# Patient Record
Sex: Female | Born: 1980
Health system: Southern US, Community
[De-identification: ages and names within clinical notes are randomized; demographics above are authoritative.]

## PROBLEM LIST (undated history)

## (undated) DIAGNOSIS — F32A Depression, unspecified: Secondary | ICD-10-CM

## (undated) DIAGNOSIS — J45909 Unspecified asthma, uncomplicated: Secondary | ICD-10-CM

## (undated) DIAGNOSIS — G2581 Restless legs syndrome: Secondary | ICD-10-CM

## (undated) DIAGNOSIS — G43909 Migraine, unspecified, not intractable, without status migrainosus: Secondary | ICD-10-CM

## (undated) DIAGNOSIS — F329 Major depressive disorder, single episode, unspecified: Secondary | ICD-10-CM

## (undated) DIAGNOSIS — M1990 Unspecified osteoarthritis, unspecified site: Secondary | ICD-10-CM

## (undated) DIAGNOSIS — F419 Anxiety disorder, unspecified: Secondary | ICD-10-CM

## (undated) DIAGNOSIS — M357 Hypermobility syndrome: Secondary | ICD-10-CM

## (undated) HISTORY — PX: NO PAST SURGERIES: SHX2092

## (undated) HISTORY — DX: Unspecified asthma, uncomplicated: J45.909

## (undated) HISTORY — DX: Unspecified osteoarthritis, unspecified site: M19.90

## (undated) HISTORY — DX: Depression, unspecified: F32.A

## (undated) HISTORY — DX: Anxiety disorder, unspecified: F41.9

## (undated) HISTORY — DX: Hypermobility syndrome: M35.7

## (undated) HISTORY — DX: Migraine, unspecified, not intractable, without status migrainosus: G43.909

## (undated) HISTORY — DX: Major depressive disorder, single episode, unspecified: F32.9

## (undated) HISTORY — DX: Restless legs syndrome: G25.81

---

## 2015-07-09 DIAGNOSIS — M549 Dorsalgia, unspecified: Secondary | ICD-10-CM | POA: Diagnosis not present

## 2015-07-09 DIAGNOSIS — N3000 Acute cystitis without hematuria: Secondary | ICD-10-CM | POA: Diagnosis not present

## 2015-08-25 DIAGNOSIS — F39 Unspecified mood [affective] disorder: Secondary | ICD-10-CM | POA: Diagnosis not present

## 2015-08-25 DIAGNOSIS — Z0000 Encounter for general adult medical examination without abnormal findings: Secondary | ICD-10-CM | POA: Diagnosis not present

## 2015-08-25 DIAGNOSIS — R110 Nausea: Secondary | ICD-10-CM | POA: Diagnosis not present

## 2015-08-25 DIAGNOSIS — M2550 Pain in unspecified joint: Secondary | ICD-10-CM | POA: Diagnosis not present

## 2015-08-25 DIAGNOSIS — R21 Rash and other nonspecific skin eruption: Secondary | ICD-10-CM | POA: Diagnosis not present

## 2015-08-25 DIAGNOSIS — M791 Myalgia: Secondary | ICD-10-CM | POA: Diagnosis not present

## 2015-08-25 DIAGNOSIS — G629 Polyneuropathy, unspecified: Secondary | ICD-10-CM | POA: Diagnosis not present

## 2015-10-12 ENCOUNTER — Other Ambulatory Visit: Payer: Self-pay | Admitting: *Deleted

## 2015-10-12 MED ORDER — ROPINIROLE HCL 0.5 MG PO TABS: ORAL_TABLET | ORAL | 3 refills | Status: DC

## 2015-10-17 ENCOUNTER — Other Ambulatory Visit: Payer: Self-pay | Admitting: *Deleted

## 2015-10-17 MED ORDER — ALPRAZOLAM 0.5 MG PO TBDP: ORAL_TABLET | ORAL | 0 refills | Status: DC

## 2015-10-17 MED ORDER — ALPRAZOLAM 0.5 MG PO TABS: ORAL_TABLET | ORAL | 0 refills | Status: DC

## 2015-10-17 NOTE — Telephone Encounter (Signed)
rx called into pharmacy

## 2015-10-17 NOTE — Telephone Encounter (Signed)
Last filled 08/30/2015. Please advise

## 2015-10-20 ENCOUNTER — Ambulatory Visit: Payer: Self-pay | Admitting: Physician Assistant

## 2015-11-06 ENCOUNTER — Other Ambulatory Visit: Payer: Self-pay | Admitting: Physician Assistant

## 2015-12-07 ENCOUNTER — Other Ambulatory Visit: Payer: Self-pay

## 2015-12-21 ENCOUNTER — Ambulatory Visit: Payer: Self-pay

## 2015-12-22 ENCOUNTER — Ambulatory Visit (INDEPENDENT_AMBULATORY_CARE_PROVIDER_SITE_OTHER): Payer: BLUE CROSS/BLUE SHIELD | Admitting: Physician Assistant

## 2015-12-22 ENCOUNTER — Encounter: Payer: Self-pay | Admitting: Physician Assistant

## 2015-12-22 VITALS — BP 118/81 | HR 80 | Temp 97.4°F | Ht 68.0 in | Wt 180.6 lb

## 2015-12-22 DIAGNOSIS — Z309 Encounter for contraceptive management, unspecified: Secondary | ICD-10-CM

## 2015-12-22 DIAGNOSIS — G43909 Migraine, unspecified, not intractable, without status migrainosus: Secondary | ICD-10-CM | POA: Insufficient documentation

## 2015-12-22 DIAGNOSIS — M357 Hypermobility syndrome: Secondary | ICD-10-CM | POA: Diagnosis not present

## 2015-12-22 DIAGNOSIS — G2581 Restless legs syndrome: Secondary | ICD-10-CM | POA: Diagnosis not present

## 2015-12-22 DIAGNOSIS — R059 Cough, unspecified: Secondary | ICD-10-CM

## 2015-12-22 DIAGNOSIS — G43801 Other migraine, not intractable, with status migrainosus: Secondary | ICD-10-CM | POA: Diagnosis not present

## 2015-12-22 DIAGNOSIS — Z23 Encounter for immunization: Secondary | ICD-10-CM | POA: Diagnosis not present

## 2015-12-22 DIAGNOSIS — R05 Cough: Secondary | ICD-10-CM | POA: Diagnosis not present

## 2015-12-22 DIAGNOSIS — F3341 Major depressive disorder, recurrent, in partial remission: Secondary | ICD-10-CM | POA: Insufficient documentation

## 2015-12-22 DIAGNOSIS — M25569 Pain in unspecified knee: Secondary | ICD-10-CM | POA: Diagnosis not present

## 2015-12-22 MED ORDER — ROPINIROLE HCL 2 MG PO TABS: ORAL_TABLET | ORAL | 6 refills | Status: DC

## 2015-12-22 MED ORDER — ALBUTEROL SULFATE HFA 108 (90 BASE) MCG/ACT IN AERS: 2.0000 | INHALATION_SPRAY | Freq: Four times a day (QID) | RESPIRATORY_TRACT | 0 refills | Status: DC | PRN

## 2015-12-22 MED ORDER — HYDROCODONE-ACETAMINOPHEN 10-325 MG PO TABS: 1.0000 | ORAL_TABLET | Freq: Four times a day (QID) | ORAL | 0 refills | Status: DC | PRN

## 2015-12-22 MED ORDER — HYDROCODONE-ACETAMINOPHEN 10-325 MG PO TABS: 1.0000 | ORAL_TABLET | Freq: Three times a day (TID) | ORAL | 0 refills | Status: DC | PRN

## 2015-12-22 MED ORDER — MEDROXYPROGESTERONE ACETATE 150 MG/ML IM SUSP
150.0000 mg | Freq: Once | INTRAMUSCULAR | Status: AC
  Administered 2016-03-16: 150 mg via INTRAMUSCULAR

## 2015-12-22 NOTE — Patient Instructions (Signed)

## 2015-12-22 NOTE — Progress Notes (Signed)
BP 118/81   Pulse 80   Temp 97.4 F (36.3 C) (Oral)   Ht 5\' 8"  (1.727 m)   Wt 180 lb 9.6 oz (81.9 kg)   LMP 12/22/2015   BMI 27.46 kg/m    Subjective:    Patient ID: Cynthia Hayden, female    DOB: Jul 19, 1980, 35 y.o.   MRN: [id number]  Cynthia Hayden is a 35 y.o. female presenting on 12/22/2015 for Cough  HPI Patient here to be established as new patient at [facility].  This patient is known to me from [facility]. Patient has long-standing medical history and is coming here to be established. At this time she is having increased cough that is worse when she lies down at night. She is not having any significant production with her cough. She has a past medical history of hypermobility joint syndrome. Her daughter has the same condition. She had been followed by rheumatology but at this point is is followed by [place]. All of her medications are reviewed today. We will also complete an initial pain visit questionnaire for her.  Past Medical History:  Diagnosis Date  . Anxiety   . Arthritis   . Depression   . Hypermobility syndrome   . Migraine   . RLS (restless legs syndrome)      Relevant past medical, surgical, family and social history reviewed and updated as indicated. Interim medical history since our last visit reviewed. Allergies and medications reviewed and updated.   Data reviewed from any sources in EPIC.  Review of Systems  Constitutional: Negative.  Negative for activity change, fatigue and fever.  HENT: Negative.   Eyes: Negative.   Respiratory: Positive for cough, shortness of breath and wheezing.   Cardiovascular: Negative.  Negative for chest pain.  Gastrointestinal: Negative.  Negative for abdominal pain.  Endocrine: Negative.   Genitourinary: Negative.  Negative for dysuria.  Musculoskeletal: Positive for arthralgias, joint swelling and myalgias.  Skin: Negative.   Neurological: Negative.   Psychiatric/Behavioral:  Positive for dysphoric mood. The patient is nervous/anxious.      Social History   Social History  . Marital status: Married    Spouse name: N/A  . Number of children: N/A  . Years of education: N/A   Occupational History  . Not on file.   Social History Main Topics  . Smoking status: Current Every Day Smoker    Packs/day: 1.00    Types: Cigarettes  . Smokeless tobacco: Never Used  . Alcohol use No  . Drug use: No  . Sexual activity: Not on file   Other Topics Concern  . Not on file   Social History Narrative  . No narrative on file    History reviewed. No pertinent surgical history.  Family History  Problem Relation Age of Onset  . Diabetes Mother       Medication List       Accurate as of 12/22/15 11:59 PM. Always use your most recent med list.          albuterol 108 (90 Base) MCG/ACT inhaler Commonly known as:  PROVENTIL HFA;VENTOLIN HFA Inhale 2 puffs into the lungs every 6 (six) hours as needed for wheezing or shortness of breath.   ALPRAZolam 0.5 MG tablet Commonly known as:  XANAX Take 1/2-1 twice daily as needed   HYDROcodone-acetaminophen 10-325 MG tablet Commonly known as:  NORCO Take 1-2 tablets by mouth every 6 (six) hours as needed.   HYDROcodone-acetaminophen 10-325 MG  tablet Commonly known as:  NORCO Take 1 tablet by mouth every 8 (eight) hours as needed.   HYDROcodone-acetaminophen 10-325 MG tablet Commonly known as:  NORCO Take 1 tablet by mouth every 8 (eight) hours as needed.   meloxicam 7.5 MG tablet Commonly known as:  MOBIC Take 7.5 mg by mouth daily.   rOPINIRole 2 MG tablet Commonly known as:  REQUIP Take 1-2 tablets at bedtime   SUMAtriptan 100 MG tablet Commonly known as:  IMITREX Take 100 mg by mouth every 2 (two) hours as needed for migraine. May repeat in 2 hours if headache persists or recurs.   topiramate 100 MG tablet Commonly known as:  TOPAMAX Take 100 mg by mouth 2 (two) times daily.   venlafaxine XR  75 MG 24 hr capsule Commonly known as:  EFFEXOR-XR Take 75 mg by mouth daily with breakfast.   Vitamin D3 1000 units Caps Take by mouth.          Objective:    BP 118/81   Pulse 80   Temp 97.4 F (36.3 C) (Oral)   Ht 5\' 8"  (1.727 m)   Wt 180 lb 9.6 oz (81.9 kg)   LMP 12/22/2015   BMI 27.46 kg/m   Allergies  Allergen Reactions  . Penicillins    Wt Readings from Last 3 Encounters:  12/22/15 180 lb 9.6 oz (81.9 kg)    Physical Exam  Constitutional: She is oriented to person, place, and time. She appears well-developed and well-nourished.  HENT:  Head: Normocephalic and atraumatic.  Eyes: Conjunctivae and EOM are normal. Pupils are equal, round, and reactive to light.  Neck: Normal range of motion. Neck supple.  Cardiovascular: Normal rate, regular rhythm, normal heart sounds and intact distal pulses.   Pulmonary/Chest: Effort normal and breath sounds normal.  Abdominal: Soft. Bowel sounds are normal.  Neurological: She is alert and oriented to person, place, and time. She has normal reflexes.  Skin: Skin is warm and dry. No rash noted.  Psychiatric: She has a normal mood and affect. Her behavior is normal. Judgment and thought content normal.    Beighton score for joint hypermobility*                                       SCORE:  8/9 Ability to: Left  Right  Passively dorsiflex the fifth metacarpophalangeal joint by at least 90 degrees 1  1  Oppose the thumb to the volar aspect of the ipsilateral forearm 1  1  Hyperextend the elbowby at least 10 degress 1  1  Hyperextend the knee by at least 10 degrees 1  1  Place the hands flat on the floor without bending the knees  0   * One point is scored for each of the maneuvers above. A total of nine points is achievable, and four or more points is considered an indication of generalized joint hypermobility.      Assessment & Plan:   1. Restless legs - rOPINIRole (REQUIP) 2 MG tablet; Take 1-2 tablets at bedtime   Dispense: 60 tablet; Refill: 6  2. Other migraine with status migrainosus, not intractable  3. Recurrent major depressive disorder, in partial remission (HCC)  4. Arthralgia of lower leg, unspecified laterality - HYDROcodone-acetaminophen (NORCO) 10-325 MG tablet; Take 1-2 tablets by mouth every 6 (six) hours as needed.  Dispense: 240 tablet; Refill: 0 - HYDROcodone-acetaminophen (NORCO) 10-325 MG  tablet; Take 1 tablet by mouth every 8 (eight) hours as needed.  Dispense: 240 tablet; Refill: 0 - HYDROcodone-acetaminophen (NORCO) 10-325 MG tablet; Take 1 tablet by mouth every 8 (eight) hours as needed.  Dispense: 240 tablet; Refill: 0  5. Benign joint hypermobility syndrome - HYDROcodone-acetaminophen (NORCO) 10-325 MG tablet; Take 1-2 tablets by mouth every 6 (six) hours as needed.  Dispense: 240 tablet; Refill: 0 - HYDROcodone-acetaminophen (NORCO) 10-325 MG tablet; Take 1 tablet by mouth every 8 (eight) hours as needed.  Dispense: 240 tablet; Refill: 0 - HYDROcodone-acetaminophen (NORCO) 10-325 MG tablet; Take 1 tablet by mouth every 8 (eight) hours as needed.  Dispense: 240 tablet; Refill: 0  6. Cough - albuterol (PROVENTIL HFA;VENTOLIN HFA) 108 (90 Base) MCG/ACT inhaler; Inhale 2 puffs into the lungs every 6 (six) hours as needed for wheezing or shortness of breath.  Dispense: 1 Inhaler; Refill: 0  7. Encounter for contraceptive management, unspecified type - medroxyPROGESTERone (DEPO-PROVERA) injection 150 mg; Inject 1 mL (150 mg total) into the muscle once.  8. Encounter for immunization - Flu Vaccine QUAD 36+ mos IM   Continue all other maintenance medications as listed above. Educational handout given for joint pain  Follow up plan: Return in about 3 months (around 03/23/2016) for recheck.  Remus LofflerAngel S. Edyn Qazi PA-C [facility] [address]  [place], [place] [id number] [phone]   12/23/2015, 10:28 AM      [facility]  Database reviewed- Yes If yes- were their any concerning findings : no   [employer]ox[employer] drug screen performed- No  SOAPP 0= never  1= seldom  2=sometimes  3= often  4= very often  1. How often do you have mood swings? 0 2. How often do you smoke a cigarette within an hour after waling up? 2 3. How often have you taken medication other than the way that it was prescribed?0 4. How often have you used illegal drugs in the past 5 years? 0 5. How often, in your lifetime, have you had legal problems or been arrested? 0  Score 2  Alcohol Audit - How often during the last year have found that you: 0-Never   1- Less than monthly   2- Monthly     3-Weekly     4-daily or almost daily  1. found that you were not able to stop drinking once you started- 0 2. failed to do what was normally expected of you because of drinking- 0 3. needed a first drink in the morning- 0 4. had a feeling of guilt or remorse after drinking- 0 5. are/were unable to remember what happened the night before because of your drinking- 0  0- NO   2- yes but not in last year  4- yes during last year 6. Have you or someone else been injured because of your drinking- 0 7. Has anyone been concerned about your drinking or suggested you cut down- 0        TOTAL- 0   ( 0-7- alcohol education, 8-15- simple advice, 16-19 simple advice plus counseling, 20-40 referral for evaluation and treatment 0   [facility] [place], [place]  Pain assessment: Cause of pain- hypermobility joint syndrome Pain location- hands, knees, ankles, elbows Pain on scale of 1-10- 6-8 Frequency- daily What increases pain-standing and lifting What makes pain Better-rest, meds Effects on ADL - mild  Prior treatments tried and failed- NSAIDS, Pt, rest Current treatments- NSAID, hydrocodone Morphine mg equivalent- 80  mg or less  Pain management agreement reviewed and signed- Yes  Remus Loffler PA-C [facility] [address]  [place], [place] [address] [phone]

## 2015-12-23 ENCOUNTER — Encounter: Payer: Self-pay | Admitting: Physician Assistant

## 2016-01-08 ENCOUNTER — Other Ambulatory Visit: Payer: Self-pay | Admitting: *Deleted

## 2016-01-08 MED ORDER — ALPRAZOLAM 0.5 MG PO TABS: ORAL_TABLET | ORAL | 0 refills | Status: DC

## 2016-01-08 NOTE — Telephone Encounter (Signed)
rx called into pharmacy

## 2016-01-09 ENCOUNTER — Other Ambulatory Visit: Payer: Self-pay | Admitting: Physician Assistant

## 2016-02-13 ENCOUNTER — Other Ambulatory Visit: Payer: Self-pay | Admitting: Physician Assistant

## 2016-02-14 ENCOUNTER — Ambulatory Visit (INDEPENDENT_AMBULATORY_CARE_PROVIDER_SITE_OTHER): Payer: BLUE CROSS/BLUE SHIELD

## 2016-02-14 ENCOUNTER — Encounter: Payer: Self-pay | Admitting: Physician Assistant

## 2016-02-14 ENCOUNTER — Ambulatory Visit (INDEPENDENT_AMBULATORY_CARE_PROVIDER_SITE_OTHER): Payer: BLUE CROSS/BLUE SHIELD | Admitting: Physician Assistant

## 2016-02-14 VITALS — BP 105/66 | HR 81 | Temp 97.1°F | Ht 68.0 in | Wt 182.8 lb

## 2016-02-14 DIAGNOSIS — R059 Cough, unspecified: Secondary | ICD-10-CM

## 2016-02-14 DIAGNOSIS — J411 Mucopurulent chronic bronchitis: Secondary | ICD-10-CM

## 2016-02-14 DIAGNOSIS — Z716 Tobacco abuse counseling: Secondary | ICD-10-CM | POA: Diagnosis not present

## 2016-02-14 DIAGNOSIS — R05 Cough: Secondary | ICD-10-CM

## 2016-02-14 DIAGNOSIS — R058 Other specified cough: Secondary | ICD-10-CM

## 2016-02-14 DIAGNOSIS — Z720 Tobacco use: Secondary | ICD-10-CM

## 2016-02-14 MED ORDER — FLUTICASONE FUROATE-VILANTEROL 200-25 MCG/INH IN AEPB: 1.0000 | INHALATION_SPRAY | Freq: Every day | RESPIRATORY_TRACT | 0 refills | Status: DC

## 2016-02-14 MED ORDER — NICOTINE 21 MG/24HR TD PT24: 21.0000 mg | MEDICATED_PATCH | Freq: Every day | TRANSDERMAL | 2 refills | Status: DC

## 2016-02-14 MED ORDER — AZITHROMYCIN 250 MG PO TABS: ORAL_TABLET | ORAL | 0 refills | Status: DC

## 2016-02-14 NOTE — Progress Notes (Signed)
BP 105/66   Pulse 81   Temp 97.1 F (36.2 C) (Oral)   Ht 5\' 8"  (1.727 m)   Wt 182 lb 12.8 oz (82.9 kg)   BMI 27.79 kg/m    Subjective:    Patient ID: Cynthia Hayden, female    DOB: 03/04/1980, 35 y.o.   MRN: [id number]  HPI: Cynthia Hayden is a 35 y.o. female presenting on 02/14/2016 for Cough  This patient has had cough that is gone on for many months. She was using albuterol as needed and it was not helping. She continued to smoke and smokes quite heavily using some altered type cigars. She is very concerned about this chronic cough in her health. And is willing to get started with some smoking cessation. We've had a long discussion about that. In addition her chest x-ray is performed to rule out any other unseen conditions. Preliminary reading is normal. In just the last 2-3 weeks she has had daily near round-the-clock coughing. Has had some production. She denies any blood.  Past Medical History:  Diagnosis Date  . Anxiety   . Arthritis   . Depression   . Hypermobility syndrome   . Migraine   . RLS (restless legs syndrome)    Relevant past medical, surgical, family and social history reviewed and updated as indicated. Interim medical history since our last visit reviewed. Allergies and medications reviewed and updated. DATA REVIEWED: CHART IN EPIC  Social History   Social History  . Marital status: Married    Spouse name: N/A  . Number of children: N/A  . Years of education: N/A   Occupational History  . Not on file.   Social History Main Topics  . Smoking status: Current Every Day Smoker    Packs/day: 1.00    Types: Cigarettes  . Smokeless tobacco: Never Used  . Alcohol use No  . Drug use: No  . Sexual activity: Not on file   Other Topics Concern  . Not on file   Social History Narrative  . No narrative on file    History reviewed. No pertinent surgical history.  Family History  Problem Relation Age of Onset  . Diabetes Mother     Review of  Systems  Constitutional: Positive for fatigue. Negative for activity change, appetite change and fever.  HENT: Positive for sore throat. Negative for congestion.   Eyes: Negative.   Respiratory: Positive for cough, chest tightness, shortness of breath and wheezing.   Cardiovascular: Negative for chest pain, palpitations and leg swelling.  Gastrointestinal: Negative.   Genitourinary: Negative.     Allergies as of 02/14/2016      Reactions   Penicillins       Medication List       Accurate as of 02/14/16  8:49 AM. Always use your most recent med list.          albuterol 108 (90 Base) MCG/ACT inhaler Commonly known as:  PROVENTIL HFA;VENTOLIN HFA Inhale 2 puffs into the lungs every 6 (six) hours as needed for wheezing or shortness of breath.   ALPRAZolam 0.5 MG tablet Commonly known as:  XANAX Take 1/2-1 twice daily as needed   fluticasone furoate-vilanterol 200-25 MCG/INH Aepb Commonly known as:  BREO ELLIPTA Inhale 1 puff into the lungs daily.   HYDROcodone-acetaminophen 10-325 MG tablet Commonly known as:  NORCO Take 1-2 tablets by mouth every 6 (six) hours as needed.   HYDROcodone-acetaminophen 10-325 MG tablet Commonly known as:  NORCO Take 1 tablet  by mouth every 8 (eight) hours as needed.   HYDROcodone-acetaminophen 10-325 MG tablet Commonly known as:  NORCO Take 1 tablet by mouth every 8 (eight) hours as needed.   meloxicam 7.5 MG tablet Commonly known as:  MOBIC TAKE 1 TABLET EVERY 12 HOURS WITH FOOD OR MILK   rOPINIRole 2 MG tablet Commonly known as:  REQUIP Take 1-2 tablets at bedtime   SUMAtriptan 100 MG tablet Commonly known as:  IMITREX Take 100 mg by mouth every 2 (two) hours as needed for migraine. May repeat in 2 hours if headache persists or recurs.   topiramate 100 MG tablet Commonly known as:  TOPAMAX Take 100 mg by mouth 2 (two) times daily.   venlafaxine XR 75 MG 24 hr capsule Commonly known as:  EFFEXOR-XR Take 75 mg by mouth daily  with breakfast.   Vitamin D3 1000 units Caps Take by mouth.          Objective:    BP 105/66   Pulse 81   Temp 97.1 F (36.2 C) (Oral)   Ht 5\' 8"  (1.727 m)   Wt 182 lb 12.8 oz (82.9 kg)   BMI 27.79 kg/m   Allergies  Allergen Reactions  . Penicillins     Wt Readings from Last 3 Encounters:  02/14/16 182 lb 12.8 oz (82.9 kg)  12/22/15 180 lb 9.6 oz (81.9 kg)    Physical Exam  Constitutional: She is oriented to person, place, and time. She appears well-developed and well-nourished.  HENT:  Head: Normocephalic and atraumatic.  Right Ear: There is drainage and tenderness.  Left Ear: There is drainage and tenderness.  Nose: Mucosal edema and rhinorrhea present. Right sinus exhibits maxillary sinus tenderness and frontal sinus tenderness. Left sinus exhibits maxillary sinus tenderness and frontal sinus tenderness.  Mouth/Throat: Oropharyngeal exudate and posterior oropharyngeal erythema present.  Eyes: Conjunctivae and EOM are normal. Pupils are equal, round, and reactive to light.  Neck: Normal range of motion. Neck supple.  Cardiovascular: Normal rate, regular rhythm, normal heart sounds and intact distal pulses.   Pulmonary/Chest: Effort normal. She has wheezes in the right upper field and the left upper field.  Abdominal: Soft. Bowel sounds are normal.  Neurological: She is alert and oriented to person, place, and time. She has normal reflexes.  Skin: Skin is warm and dry. No rash noted.  Psychiatric: She has a normal mood and affect. Her behavior is normal. Judgment and thought content normal.         Assessment & Plan:   1. Cough - DG Chest 2 View; Future  2. Recurrent productive cough - DG Chest 2 View; Future - fluticasone furoate-vilanterol (BREO ELLIPTA) 200-25 MCG/INH AEPB; Inhale 1 puff into the lungs daily.  Dispense: 60 each; Refill: 0  3. Mucopurulent chronic bronchitis (HCC) - fluticasone furoate-vilanterol (BREO ELLIPTA) 200-25 MCG/INH AEPB; Inhale  1 puff into the lungs daily.  Dispense: 60 each; Refill: 0 - azithromycin (ZITHROMAX Z-PAK) 250 MG tablet; As directed  Dispense: 6 tablet; Refill: 0  4. Encounter for smoking cessation counseling - nicotine (NICODERM CQ - DOSED IN MG/24 HOURS) 21 mg/24hr patch; Place 1 patch (21 mg total) onto the skin daily.  Dispense: 28 patch; Refill: 2    Continue all other maintenance medications as listed above.  Follow up plan: Return in about 4 weeks (around 03/13/2016) for recheck.  Orders Placed This Encounter  Procedures  . DG Chest 2 View    Educational handout given for COPD  Wayland Salinasngel S.  Darla LeschesJones PA-C [facility] [address]  [place], [place] [id number] [phone]   02/14/2016, 8:49 AM

## 2016-02-14 NOTE — Patient Instructions (Signed)
Chronic Obstructive Pulmonary Disease Chronic obstructive pulmonary disease (COPD) is a common lung problem. In COPD, the flow of air from the lungs is limited. The way your lungs work will probably never return to normal, but there are things you can do to improve your lungs and make yourself feel better. Your doctor may treat your condition with:  Medicines.  Oxygen.  Lung surgery.  Changes to your diet.  Rehabilitation. This may involve a team of specialists. Follow these instructions at home:  Take all medicines as told by your doctor.  Avoid medicines or cough syrups that dry up your airway (such as antihistamines) and do not allow you to get rid of thick spit. You do not need to avoid them if told differently by your doctor.  If you smoke, stop. Smoking makes the problem worse.  Avoid being around things that make your breathing worse (like smoke, chemicals, and fumes).  Use oxygen therapy and therapy to help improve your lungs (pulmonary rehabilitation) if told by your doctor. If you need home oxygen therapy, ask your doctor if you should buy a tool to measure your oxygen level (oximeter).  Avoid people who have a sickness you can catch (contagious).  Avoid going outside when it is very hot, cold, or humid.  Eat healthy foods. Eat smaller meals more often. Rest before meals.  Stay active, but remember to also rest.  Make sure to get all the shots (vaccines) your doctor recommends. Ask your doctor if you need a pneumonia shot.  Learn and use tips on how to relax.  Learn and use tips on how to control your breathing as told by your doctor. Try: 1. Breathing in (inhaling) through your nose for 1 second. Then, pucker your lips and breath out (exhale) through your lips for 2 seconds. 2. Putting one hand on your belly (abdomen). Breathe in slowly through your nose for 1 second. Your hand on your belly should move out. Pucker your lips and breathe out slowly through your lips.  Your hand on your belly should move in as you breathe out.  Learn and use controlled coughing to clear thick spit from your lungs. The steps are: 1. Lean your head a little forward. 2. Breathe in deeply. 3. Try to hold your breath for 3 seconds. 4. Keep your mouth slightly open while coughing 2 times. 5. Spit any thick spit out into a tissue. 6. Rest and do the steps again 1 or 2 times as needed. Contact a doctor if:  You cough up more thick spit than usual.  There is a change in the color or thickness of the spit.  It is harder to breathe than usual.  Your breathing is faster than usual. Get help right away if:  You have shortness of breath while resting.  You have shortness of breath that stops you from:  Being able to talk.  Doing normal activities.  You chest hurts for longer than 5 minutes.  Your skin color is more blue than usual.  Your pulse oximeter shows that you have low oxygen for longer than 5 minutes. This information is not intended to replace advice given to you by your health care provider. Make sure you discuss any questions you have with your health care provider. Document Released: 07/31/2007 Document Revised: 07/20/2015 Document Reviewed: 10/08/2012 Elsevier Interactive Patient Education  2017 Elsevier Inc.  

## 2016-02-16 ENCOUNTER — Other Ambulatory Visit: Payer: Self-pay | Admitting: Physician Assistant

## 2016-02-21 ENCOUNTER — Other Ambulatory Visit: Payer: Self-pay | Admitting: Physician Assistant

## 2016-02-21 NOTE — Telephone Encounter (Signed)
Patient last seen in office on 12/20. Unsure of date of last RF due to pt being new in system. Please advise and route to Pool A so nurse can phone in to pharmacy

## 2016-02-22 ENCOUNTER — Telehealth: Payer: Self-pay | Admitting: Physician Assistant

## 2016-02-22 NOTE — Telephone Encounter (Signed)
[facility] Prudy FeelerAngel Jones

## 2016-03-04 ENCOUNTER — Telehealth: Payer: Self-pay | Admitting: Physician Assistant

## 2016-03-04 NOTE — Telephone Encounter (Signed)
appt was changed

## 2016-03-08 ENCOUNTER — Ambulatory Visit: Payer: BLUE CROSS/BLUE SHIELD | Admitting: Physician Assistant

## 2016-03-11 ENCOUNTER — Ambulatory Visit: Payer: BLUE CROSS/BLUE SHIELD | Admitting: Physician Assistant

## 2016-03-13 ENCOUNTER — Ambulatory Visit: Payer: BLUE CROSS/BLUE SHIELD | Admitting: Physician Assistant

## 2016-03-15 ENCOUNTER — Ambulatory Visit (INDEPENDENT_AMBULATORY_CARE_PROVIDER_SITE_OTHER): Payer: BLUE CROSS/BLUE SHIELD | Admitting: Physician Assistant

## 2016-03-15 ENCOUNTER — Encounter: Payer: Self-pay | Admitting: Physician Assistant

## 2016-03-15 VITALS — BP 116/74 | HR 76 | Temp 97.0°F | Ht 68.0 in | Wt 185.4 lb

## 2016-03-15 DIAGNOSIS — Z309 Encounter for contraceptive management, unspecified: Secondary | ICD-10-CM

## 2016-03-15 DIAGNOSIS — G2581 Restless legs syndrome: Secondary | ICD-10-CM

## 2016-03-15 DIAGNOSIS — G43801 Other migraine, not intractable, with status migrainosus: Secondary | ICD-10-CM | POA: Diagnosis not present

## 2016-03-15 DIAGNOSIS — R05 Cough: Secondary | ICD-10-CM | POA: Diagnosis not present

## 2016-03-15 DIAGNOSIS — F3341 Major depressive disorder, recurrent, in partial remission: Secondary | ICD-10-CM

## 2016-03-15 DIAGNOSIS — N946 Dysmenorrhea, unspecified: Secondary | ICD-10-CM

## 2016-03-15 DIAGNOSIS — R058 Other specified cough: Secondary | ICD-10-CM | POA: Insufficient documentation

## 2016-03-15 DIAGNOSIS — M357 Hypermobility syndrome: Secondary | ICD-10-CM | POA: Insufficient documentation

## 2016-03-15 DIAGNOSIS — J411 Mucopurulent chronic bronchitis: Secondary | ICD-10-CM | POA: Diagnosis not present

## 2016-03-15 DIAGNOSIS — M25569 Pain in unspecified knee: Secondary | ICD-10-CM | POA: Diagnosis not present

## 2016-03-15 MED ORDER — HYDROCODONE-ACETAMINOPHEN 10-325 MG PO TABS: 1.0000 | ORAL_TABLET | Freq: Three times a day (TID) | ORAL | 0 refills | Status: DC | PRN

## 2016-03-15 MED ORDER — MEDROXYPROGESTERONE ACETATE 150 MG/ML IM SUSP: 150.0000 mg | Freq: Once | INTRAMUSCULAR | 2 refills | Status: DC

## 2016-03-15 MED ORDER — HYDROCODONE-ACETAMINOPHEN 10-325 MG PO TABS: 1.0000 | ORAL_TABLET | Freq: Four times a day (QID) | ORAL | 0 refills | Status: DC | PRN

## 2016-03-15 MED ORDER — FLUTICASONE FUROATE-VILANTEROL 200-25 MCG/INH IN AEPB: 1.0000 | INHALATION_SPRAY | Freq: Every day | RESPIRATORY_TRACT | 11 refills | Status: DC

## 2016-03-15 MED ORDER — KETOROLAC TROMETHAMINE 60 MG/2ML IM SOLN
60.0000 mg | Freq: Once | INTRAMUSCULAR | Status: AC
  Administered 2016-03-15: 60 mg via INTRAMUSCULAR

## 2016-03-15 NOTE — Patient Instructions (Signed)
Steps to Quit Smoking Smoking tobacco can be bad for your health. It can also affect almost every organ in your body. Smoking puts you and people around you at risk for many serious long-lasting (chronic) diseases. Quitting smoking is hard, but it is one of the best things that you can do for your health. It is never too late to quit. What are the benefits of quitting smoking? When you quit smoking, you lower your risk for getting serious diseases and conditions. They can include:  Lung cancer or lung disease.  Heart disease.  Stroke.  Heart attack.  Not being able to have children (infertility).  Weak bones (osteoporosis) and broken bones (fractures). If you have coughing, wheezing, and shortness of breath, those symptoms may get better when you quit. You may also get sick less often. If you are pregnant, quitting smoking can help to lower your chances of having a baby of low birth weight. What can I do to help me quit smoking? Talk with your doctor about what can help you quit smoking. Some things you can do (strategies) include:  Quitting smoking totally, instead of slowly cutting back how much you smoke over a period of time.  Going to in-person counseling. You are more likely to quit if you go to many counseling sessions.  Using resources and support systems, such as:  Online chats with a counselor.  Phone quitlines.  Printed self-help materials.  Support groups or group counseling.  Text messaging programs.  Mobile phone apps or applications.  Taking medicines. Some of these medicines may have nicotine in them. If you are pregnant or breastfeeding, do not take any medicines to quit smoking unless your doctor says it is okay. Talk with your doctor about counseling or other things that can help you. Talk with your doctor about using more than one strategy at the same time, such as taking medicines while you are also going to in-person counseling. This can help make quitting  easier. What things can I do to make it easier to quit? Quitting smoking might feel very hard at first, but there is a lot that you can do to make it easier. Take these steps:  Talk to your family and friends. Ask them to support and encourage you.  Call phone quitlines, reach out to support groups, or work with a counselor.  Ask people who smoke to not smoke around you.  Avoid places that make you want (trigger) to smoke, such as:  Bars.  Parties.  Smoke-break areas at work.  Spend time with people who do not smoke.  Lower the stress in your life. Stress can make you want to smoke. Try these things to help your stress:  Getting regular exercise.  Deep-breathing exercises.  Yoga.  Meditating.  Doing a body scan. To do this, close your eyes, focus on one area of your body at a time from head to toe, and notice which parts of your body are tense. Try to relax the muscles in those areas.  Download or buy apps on your mobile phone or tablet that can help you stick to your quit plan. There are many free apps, such as QuitGuide from the CDC (Centers for Disease Control and Prevention). You can find more support from smokefree.gov and other websites. This information is not intended to replace advice given to you by your health care provider. Make sure you discuss any questions you have with your health care provider. Document Released: 12/08/2008 Document Revised: 10/10/2015 Document   Reviewed: 06/28/2014 Elsevier Interactive Patient Education  2017 Elsevier Inc.  

## 2016-03-16 ENCOUNTER — Ambulatory Visit (INDEPENDENT_AMBULATORY_CARE_PROVIDER_SITE_OTHER): Payer: BLUE CROSS/BLUE SHIELD | Admitting: *Deleted

## 2016-03-16 DIAGNOSIS — Z3042 Encounter for surveillance of injectable contraceptive: Secondary | ICD-10-CM

## 2016-03-16 DIAGNOSIS — Z309 Encounter for contraceptive management, unspecified: Secondary | ICD-10-CM

## 2016-03-16 NOTE — Progress Notes (Signed)
Pt given depo provera 150mg /531ml IM LUOQ and tolerated well.

## 2016-03-17 DIAGNOSIS — N946 Dysmenorrhea, unspecified: Secondary | ICD-10-CM | POA: Insufficient documentation

## 2016-03-17 NOTE — Progress Notes (Signed)
BP 116/74   Pulse 76   Temp 97 F (36.1 C) (Oral)   Ht 5\' 8"  (1.727 m)   Wt 185 lb 6.4 oz (84.1 kg)   BMI 28.19 kg/m    Subjective:    Patient ID: Cynthia Hayden, female    DOB: 1980/09/19, 36 y.o.   MRN: [id number]  HPI: Cynthia Hayden is a 36 y.o. female presenting on 03/15/2016 for Migraine (x 3 days) and Medication Refill  This patient comes in for periodic recheck on medications and conditions. All medications are reviewed today. There are no reports of any problems with the medications. All of the medical conditions are reviewed and updated.  Lab work is reviewed and will be ordered as medically necessary.   She is having increased problems with migraines. She has had her current one for the past 3 days. She does note an increase around her menstrual cycle. She has not been doing her Depo-Provera shot will go ahead and get that started back. She has not had a Toradol injection for her headache and we can give that to her today she has a driver.  Past Medical History:  Diagnosis Date  . Anxiety   . Arthritis   . Depression   . Hypermobility syndrome   . Migraine   . RLS (restless legs syndrome)    Relevant past medical, surgical, family and social history reviewed and updated as indicated. Interim medical history since our last visit reviewed. Allergies and medications reviewed and updated. DATA REVIEWED: CHART IN EPIC  Social History   Social History  . Marital status: Married    Spouse name: N/A  . Number of children: N/A  . Years of education: N/A   Occupational History  . Not on file.   Social History Main Topics  . Smoking status: Current Every Day Smoker    Packs/day: 1.00    Types: Cigarettes  . Smokeless tobacco: Never Used  . Alcohol use No  . Drug use: No  . Sexual activity: Not on file   Other Topics Concern  . Not on file   Social History Narrative  . No narrative on file    History reviewed. No pertinent surgical history.  Family History   Problem Relation Age of Onset  . Diabetes Mother     Review of Systems  Constitutional: Negative.  Negative for activity change, fatigue and fever.  HENT: Negative.   Eyes: Negative.   Respiratory: Negative.  Negative for cough.   Cardiovascular: Negative.  Negative for chest pain.  Gastrointestinal: Negative.  Negative for abdominal pain, nausea and vomiting.  Endocrine: Negative.   Genitourinary: Negative.  Negative for dysuria.  Musculoskeletal: Negative.   Skin: Negative.   Neurological: Positive for headaches. Negative for dizziness.    Allergies as of 03/15/2016      Reactions   Penicillins       Medication List       Accurate as of 03/15/16 11:59 PM. Always use your most recent med list.          albuterol 108 (90 Base) MCG/ACT inhaler Commonly known as:  PROVENTIL HFA;VENTOLIN HFA Inhale 2 puffs into the lungs every 6 (six) hours as needed for wheezing or shortness of breath.   ALPRAZolam 0.5 MG tablet Commonly known as:  XANAX TAKE 1/2 TO 1 TABLET TWICE A DAY   fluticasone furoate-vilanterol 200-25 MCG/INH Aepb Commonly known as:  BREO ELLIPTA Inhale 1 puff into the lungs daily.   HYDROcodone-acetaminophen  10-325 MG tablet Commonly known as:  NORCO Take 1 tablet by mouth every 8 (eight) hours as needed.   HYDROcodone-acetaminophen 10-325 MG tablet Commonly known as:  NORCO Take 1 tablet by mouth every 8 (eight) hours as needed.   HYDROcodone-acetaminophen 10-325 MG tablet Commonly known as:  NORCO Take 1-2 tablets by mouth every 6 (six) hours as needed.   medroxyPROGESTERone 150 MG/ML injection Commonly known as:  DEPO-PROVERA Inject 1 mL (150 mg total) into the muscle once.   meloxicam 7.5 MG tablet Commonly known as:  MOBIC TAKE 1 TABLET EVERY 12 HOURS WITH FOOD OR MILK   nicotine 21 mg/24hr patch Commonly known as:  NICODERM CQ - dosed in mg/24 hours Place 1 patch (21 mg total) onto the skin daily.   rOPINIRole 2 MG tablet Commonly known  as:  REQUIP Take 1-2 tablets at bedtime   SUMAtriptan 100 MG tablet Commonly known as:  IMITREX Take 100 mg by mouth every 2 (two) hours as needed for migraine. May repeat in 2 hours if headache persists or recurs.   topiramate 100 MG tablet Commonly known as:  TOPAMAX Take 100 mg by mouth 2 (two) times daily.   venlafaxine XR 75 MG 24 hr capsule Commonly known as:  EFFEXOR-XR Take 75 mg by mouth daily with breakfast.   Vitamin D3 1000 units Caps Take by mouth.          Objective:    BP 116/74   Pulse 76   Temp 97 F (36.1 C) (Oral)   Ht 5\' 8"  (1.727 m)   Wt 185 lb 6.4 oz (84.1 kg)   BMI 28.19 kg/m   Allergies  Allergen Reactions  . Penicillins     Wt Readings from Last 3 Encounters:  03/15/16 185 lb 6.4 oz (84.1 kg)  02/14/16 182 lb 12.8 oz (82.9 kg)  12/22/15 180 lb 9.6 oz (81.9 kg)    Physical Exam  Constitutional: She is oriented to person, place, and time. She appears well-developed and well-nourished. She appears distressed.  HENT:  Head: Normocephalic and atraumatic.  Eyes: Conjunctivae and EOM are normal. Pupils are equal, round, and reactive to light.  Cardiovascular: Normal rate, regular rhythm, normal heart sounds and intact distal pulses.   Pulmonary/Chest: Effort normal and breath sounds normal.  Abdominal: Soft. Bowel sounds are normal.  Neurological: She is alert and oriented to person, place, and time. She has normal reflexes. No cranial nerve deficit. Coordination normal.  Skin: Skin is warm and dry. No rash noted. She is not diaphoretic.  Psychiatric: She has a normal mood and affect. Her behavior is normal. Judgment and thought content normal.    No results found for this or any previous visit.    Assessment & Plan:   1. Other migraine with status migrainosus, not intractable - ketorolac (TORADOL) injection 60 mg; Inject 2 mLs (60 mg total) into the muscle once.  2. Restless legs  3. Recurrent major depressive disorder, in partial  remission (HCC)  4. Hypermobility syndrome  5. Arthralgia of lower leg, unspecified laterality - HYDROcodone-acetaminophen (NORCO) 10-325 MG tablet; Take 1 tablet by mouth every 8 (eight) hours as needed.  Dispense: 240 tablet; Refill: 0 - HYDROcodone-acetaminophen (NORCO) 10-325 MG tablet; Take 1 tablet by mouth every 8 (eight) hours as needed.  Dispense: 240 tablet; Refill: 0 - HYDROcodone-acetaminophen (NORCO) 10-325 MG tablet; Take 1-2 tablets by mouth every 6 (six) hours as needed.  Dispense: 240 tablet; Refill: 0  6. Benign joint hypermobility syndrome -  HYDROcodone-acetaminophen (NORCO) 10-325 MG tablet; Take 1 tablet by mouth every 8 (eight) hours as needed.  Dispense: 240 tablet; Refill: 0 - HYDROcodone-acetaminophen (NORCO) 10-325 MG tablet; Take 1 tablet by mouth every 8 (eight) hours as needed.  Dispense: 240 tablet; Refill: 0 - HYDROcodone-acetaminophen (NORCO) 10-325 MG tablet; Take 1-2 tablets by mouth every 6 (six) hours as needed.  Dispense: 240 tablet; Refill: 0  7. Encounter for contraceptive management, unspecified type  8. Recurrent productive cough - fluticasone furoate-vilanterol (BREO ELLIPTA) 200-25 MCG/INH AEPB; Inhale 1 puff into the lungs daily.  Dispense: 60 each; Refill: 11  9. Mucopurulent chronic bronchitis (HCC) - fluticasone furoate-vilanterol (BREO ELLIPTA) 200-25 MCG/INH AEPB; Inhale 1 puff into the lungs daily.  Dispense: 60 each; Refill: 11  10. Dysmenorrhea - medroxyPROGESTERone (DEPO-PROVERA) 150 MG/ML injection; Inject 1 mL (150 mg total) into the muscle once.  Dispense: 1 mL; Refill: 2   Continue all other maintenance medications as listed above.  Follow up plan: Return in about 4 months (around 07/13/2016) for recheck.  No orders of the defined types were placed in this encounter.   Educational handout given for smoking cessation  Remus LofflerAngel S. Josephmichael Lisenbee PA-C [facility] [address]  [place], [place]  [id number] [phone]   03/17/2016, 9:58 PM

## 2016-06-05 ENCOUNTER — Other Ambulatory Visit: Payer: Self-pay | Admitting: Physician Assistant

## 2016-06-06 NOTE — Telephone Encounter (Signed)
Rx called in 

## 2016-06-12 ENCOUNTER — Other Ambulatory Visit: Payer: Self-pay | Admitting: Physician Assistant

## 2016-06-12 DIAGNOSIS — G2581 Restless legs syndrome: Secondary | ICD-10-CM

## 2016-06-25 ENCOUNTER — Other Ambulatory Visit: Payer: Self-pay | Admitting: Physician Assistant

## 2016-06-26 ENCOUNTER — Encounter: Payer: Self-pay | Admitting: Physician Assistant

## 2016-06-26 ENCOUNTER — Ambulatory Visit (INDEPENDENT_AMBULATORY_CARE_PROVIDER_SITE_OTHER): Payer: BLUE CROSS/BLUE SHIELD | Admitting: Physician Assistant

## 2016-06-26 VITALS — BP 114/80 | HR 81 | Temp 97.1°F | Ht 68.0 in | Wt 179.8 lb

## 2016-06-26 DIAGNOSIS — Z308 Encounter for other contraceptive management: Secondary | ICD-10-CM

## 2016-06-26 DIAGNOSIS — N946 Dysmenorrhea, unspecified: Secondary | ICD-10-CM

## 2016-06-26 DIAGNOSIS — M25569 Pain in unspecified knee: Secondary | ICD-10-CM | POA: Diagnosis not present

## 2016-06-26 DIAGNOSIS — G2581 Restless legs syndrome: Secondary | ICD-10-CM | POA: Diagnosis not present

## 2016-06-26 DIAGNOSIS — M357 Hypermobility syndrome: Secondary | ICD-10-CM

## 2016-06-26 MED ORDER — HYDROCODONE-ACETAMINOPHEN 10-325 MG PO TABS: 1.0000 | ORAL_TABLET | Freq: Three times a day (TID) | ORAL | 0 refills | Status: DC | PRN

## 2016-06-26 MED ORDER — MELOXICAM 7.5 MG PO TABS: 7.5000 mg | ORAL_TABLET | Freq: Two times a day (BID) | ORAL | 3 refills | Status: DC

## 2016-06-26 MED ORDER — TOPIRAMATE 100 MG PO TABS: 100.0000 mg | ORAL_TABLET | Freq: Two times a day (BID) | ORAL | 3 refills | Status: DC

## 2016-06-26 MED ORDER — VENLAFAXINE HCL ER 75 MG PO CP24: 150.0000 mg | ORAL_CAPSULE | Freq: Every day | ORAL | 3 refills | Status: DC

## 2016-06-26 MED ORDER — HYDROCODONE-ACETAMINOPHEN 10-325 MG PO TABS: 1.0000 | ORAL_TABLET | Freq: Four times a day (QID) | ORAL | 0 refills | Status: DC | PRN

## 2016-06-26 MED ORDER — ALPRAZOLAM 0.5 MG PO TABS: 0.5000 mg | ORAL_TABLET | Freq: Two times a day (BID) | ORAL | 1 refills | Status: DC | PRN

## 2016-06-26 MED ORDER — ROPINIROLE HCL 2 MG PO TABS: 2.0000 mg | ORAL_TABLET | Freq: Every day | ORAL | 3 refills | Status: DC

## 2016-06-26 NOTE — Patient Instructions (Addendum)
Acetaminophen biphasic or extended-release tablets What is this medicine? ACETAMINOPHEN (a set a MEE noe fen) is a pain reliever. It is used to treat mild pain and fever. This medicine may be used for other purposes; ask your health care provider or pharmacist if you have questions. COMMON BRAND NAME(S): Mapap Arthritis Pain, Tylenol 8 Hour, Tylenol Arthritis Pain What should I tell my health care provider before I take this medicine? They need to know if you have any of these conditions: -if you often drink alcohol -liver disease -an unusual or allergic reaction to acetaminophen, other medicines, foods, dyes, or preservatives -pregnant or trying to get pregnant -breast-feeding How should I use this medicine? Take this medicine by mouth with a glass of water. Follow the directions on the package or prescription label. Do not cut, crush or chew this medicine. Take your medicine at regular intervals. Do not take your medicine more often than directed. Talk to your pediatrician regarding the use of this medicine in children. While this drug may be prescribed for children as young as 49 years of age for selected conditions, precautions do apply. Overdosage: If you think you have taken too much of this medicine contact a poison control center or emergency room at once. NOTE: This medicine is only for you. Do not share this medicine with others. What if I miss a dose? If you miss a dose, take it as soon as you can. If it is almost time for your next dose, take only that dose. Do not take double or extra doses. What may interact with this medicine? -alcohol -imatinib -isoniazid -other medicines with acetaminophen This list may not describe all possible interactions. Give your health care provider a list of all the medicines, herbs, non-prescription drugs, or dietary supplements you use. Also tell them if you smoke, drink alcohol, or use illegal drugs. Some items may interact with your  medicine. What should I watch for while using this medicine? Tell your doctor or health care professional if the pain lasts more than 10 days (5 days for children), if it gets worse, or if there is a new or different kind of pain. Also, check with your doctor if a fever lasts for more than 3 days. Do not take other medicines that contain acetaminophen with this medicine. Always read labels carefully. If you have questions, ask your doctor or pharmacist. If you take too much acetaminophen get medical help right away. Too much acetaminophen can be very dangerous and cause liver damage. Even if you do not have symptoms, it is important to get help right away. What side effects may I notice from receiving this medicine? Side effects that you should report to your doctor or health care professional as soon as possible: -allergic reactions like skin rash, itching or hives, swelling of the face, lips, or tongue -breathing problems -fever or sore throat -redness, blistering, peeling or loosening of the skin, including inside the mouth -trouble passing urine or change in the amount of urine -unusual bleeding or bruising -unusually weak or tired -yellowing of the eyes or skin Side effects that usually do not require medical attention (report to your doctor or health care professional if they continue or are bothersome): -headache -nausea, vomiting This list may not describe all possible side effects. Call your doctor for medical advice about side effects. You may report side effects to FDA at 1-800-FDA-1088. Where should I keep my medicine? Keep out of reach of children. Store at room temperature between 20 and  25 degrees C (68 and 77 degrees F). Protect from moisture and heat. Throw away any unused medicine after the expiration date. NOTE: This sheet is a summary. It may not cover all possible information. If you have questions about this medicine, talk to your doctor, pharmacist, or health care  provider.  2018 Elsevier/Gold Standard (2012-10-05 12:56:12) ac

## 2016-06-27 LAB — BETA HCG QUANT (REF LAB): hCG Quant: <1 m[IU]/mL

## 2016-06-28 ENCOUNTER — Ambulatory Visit (INDEPENDENT_AMBULATORY_CARE_PROVIDER_SITE_OTHER): Payer: BLUE CROSS/BLUE SHIELD | Admitting: *Deleted

## 2016-06-28 DIAGNOSIS — Z3042 Encounter for surveillance of injectable contraceptive: Secondary | ICD-10-CM

## 2016-06-28 MED ORDER — MEDROXYPROGESTERONE ACETATE 150 MG/ML IM SUSP
150.0000 mg | INTRAMUSCULAR | Status: DC
  Administered 2016-06-28 – 2019-04-29 (×2): 150 mg via INTRAMUSCULAR

## 2016-06-28 NOTE — Progress Notes (Signed)
Pt given Medroxyprogesterone inj Tolerated well 

## 2016-06-28 NOTE — Progress Notes (Signed)
BP 114/80   Pulse 81   Temp 97.1 F (36.2 C) (Oral)   Ht 5\' 8"  (1.727 m)   Wt 179 lb 12.8 oz (81.6 kg)   BMI 27.34 kg/m    Subjective:    Patient ID: Cynthia Hayden, female    DOB: [birth year], 36 y.o.   MRN: [id number]  HPI: Cynthia Hayden is a 36 y.o. female presenting on 06/26/2016 for Medication Refill  This patient comes in for periodic recheck on medications and conditions including depression, DDD, joint pain, birth control. SH ehas been stable over recent months and able to go 4 months on her 3 month supply of pain med. I have commended her on this..   All medications are reviewed today. There are no reports of any problems with the medications. All of the medical conditions are reviewed and updated.  Lab work is reviewed and will be ordered as medically necessary. There are no new problems reported with today's visit.   Relevant past medical, surgical, family and social history reviewed and updated as indicated. Allergies and medications reviewed and updated.  Past Medical History:  Diagnosis Date  . Anxiety   . Arthritis   . Depression   . Hypermobility syndrome   . Migraine   . RLS (restless legs syndrome)     History reviewed. No pertinent surgical history.  Review of Systems  Constitutional: Negative.  Negative for activity change, fatigue and fever.  HENT: Negative.   Eyes: Negative.   Respiratory: Negative.  Negative for cough.   Cardiovascular: Negative.  Negative for chest pain.  Gastrointestinal: Negative.  Negative for abdominal pain.  Endocrine: Negative.   Genitourinary: Negative.  Negative for dysuria.  Musculoskeletal: Negative.   Skin: Negative.   Neurological: Negative.     Allergies as of 06/26/2016      Reactions   Penicillins       Medication List       Accurate as of 06/26/16 11:59 PM. Always use your most recent med list.          albuterol 108 (90 Base) MCG/ACT inhaler Commonly known as:  PROVENTIL HFA;VENTOLIN HFA Inhale 2  puffs into the lungs every 6 (six) hours as needed for wheezing or shortness of breath.   ALPRAZolam 0.5 MG tablet Commonly known as:  XANAX Take 1 tablet (0.5 mg total) by mouth 2 (two) times daily as needed for anxiety.   fluticasone furoate-vilanterol 200-25 MCG/INH Aepb Commonly known as:  BREO ELLIPTA Inhale 1 puff into the lungs daily.   HYDROcodone-acetaminophen 10-325 MG tablet Commonly known as:  NORCO Take 1 tablet by mouth every 8 (eight) hours as needed.   HYDROcodone-acetaminophen 10-325 MG tablet Commonly known as:  NORCO Take 1 tablet by mouth every 8 (eight) hours as needed.   HYDROcodone-acetaminophen 10-325 MG tablet Commonly known as:  NORCO Take 1-2 tablets by mouth every 6 (six) hours as needed.   medroxyPROGESTERone 150 MG/ML injection Commonly known as:  DEPO-PROVERA Inject 1 mL (150 mg total) into the muscle once.   meloxicam 7.5 MG tablet Commonly known as:  MOBIC Take 1 tablet (7.5 mg total) by mouth 2 (two) times daily.   rOPINIRole 2 MG tablet Commonly known as:  REQUIP Take 1-2 tablets (2-4 mg total) by mouth at bedtime.   SUMAtriptan 100 MG tablet Commonly known as:  IMITREX Take 100 mg by mouth every 2 (two) hours as needed for migraine. May repeat in 2 hours if headache persists or recurs.  topiramate 100 MG tablet Commonly known as:  TOPAMAX Take 1 tablet (100 mg total) by mouth 2 (two) times daily.   venlafaxine XR 75 MG 24 hr capsule Commonly known as:  EFFEXOR-XR Take 2 capsules (150 mg total) by mouth daily with breakfast.   Vitamin D3 1000 units Caps Take by mouth.          Objective:    BP 114/80   Pulse 81   Temp 97.1 F (36.2 C) (Oral)   Ht 5\' 8"  (1.727 m)   Wt 179 lb 12.8 oz (81.6 kg)   BMI 27.34 kg/m   Allergies  Allergen Reactions  . Penicillins     Physical Exam  Constitutional: She is oriented to person, place, and time. She appears well-developed and well-nourished.  HENT:  Head: Normocephalic and  atraumatic.  Eyes: Conjunctivae and EOM are normal. Pupils are equal, round, and reactive to light.  Cardiovascular: Normal rate, regular rhythm, normal heart sounds and intact distal pulses.   Pulmonary/Chest: Effort normal and breath sounds normal.  Abdominal: Soft. Bowel sounds are normal.  Neurological: She is alert and oriented to person, place, and time. She has normal reflexes.  Skin: Skin is warm and dry. No rash noted.  Psychiatric: She has a normal mood and affect. Her behavior is normal. Judgment and thought content normal.    Results for orders placed or performed in visit on 06/26/16  Beta HCG, Quant  Result Value Ref Range   hCG Quant <1 mIU/mL      Assessment & Plan:   1. Encounter for other contraceptive management - Beta HCG, Quant  2. Dysmenorrhea  3. Arthralgia of lower leg, unspecified laterality - HYDROcodone-acetaminophen (NORCO) 10-325 MG tablet; Take 1 tablet by mouth every 8 (eight) hours as needed.  Dispense: 240 tablet; Refill: 0 - HYDROcodone-acetaminophen (NORCO) 10-325 MG tablet; Take 1 tablet by mouth every 8 (eight) hours as needed.  Dispense: 240 tablet; Refill: 0 - HYDROcodone-acetaminophen (NORCO) 10-325 MG tablet; Take 1-2 tablets by mouth every 6 (six) hours as needed.  Dispense: 240 tablet; Refill: 0 - meloxicam (MOBIC) 7.5 MG tablet; Take 1 tablet (7.5 mg total) by mouth 2 (two) times daily.  Dispense: 180 tablet; Refill: 3  4. Benign joint hypermobility syndrome - HYDROcodone-acetaminophen (NORCO) 10-325 MG tablet; Take 1 tablet by mouth every 8 (eight) hours as needed.  Dispense: 240 tablet; Refill: 0 - HYDROcodone-acetaminophen (NORCO) 10-325 MG tablet; Take 1 tablet by mouth every 8 (eight) hours as needed.  Dispense: 240 tablet; Refill: 0 - HYDROcodone-acetaminophen (NORCO) 10-325 MG tablet; Take 1-2 tablets by mouth every 6 (six) hours as needed.  Dispense: 240 tablet; Refill: 0  5. Restless legs - rOPINIRole (REQUIP) 2 MG tablet; Take  1-2 tablets (2-4 mg total) by mouth at bedtime.  Dispense: 180 tablet; Refill: 3   Continue all other maintenance medications as listed above.  Follow up plan: Return in about 3 months (around 09/26/2016) for recheck.  Educational handout given for tylenol use  Remus Loffler PA-C [facility] [address]  [place], [place] [address] [phone]   06/28/2016, 4:45 PM

## 2016-08-02 ENCOUNTER — Other Ambulatory Visit: Payer: Self-pay | Admitting: Physician Assistant

## 2016-08-05 NOTE — Telephone Encounter (Signed)
Phoned in.

## 2016-09-16 ENCOUNTER — Encounter: Payer: Self-pay | Admitting: Physician Assistant

## 2016-09-16 ENCOUNTER — Ambulatory Visit (INDEPENDENT_AMBULATORY_CARE_PROVIDER_SITE_OTHER): Payer: BLUE CROSS/BLUE SHIELD | Admitting: Physician Assistant

## 2016-09-16 VITALS — BP 117/80 | HR 55 | Temp 98.3°F | Ht 68.0 in | Wt 180.2 lb

## 2016-09-16 DIAGNOSIS — G2581 Restless legs syndrome: Secondary | ICD-10-CM

## 2016-09-16 DIAGNOSIS — M25569 Pain in unspecified knee: Secondary | ICD-10-CM

## 2016-09-16 DIAGNOSIS — M357 Hypermobility syndrome: Secondary | ICD-10-CM

## 2016-09-16 DIAGNOSIS — R252 Cramp and spasm: Secondary | ICD-10-CM | POA: Insufficient documentation

## 2016-09-16 MED ORDER — HYDROCODONE-ACETAMINOPHEN 10-325 MG PO TABS: 1.0000 | ORAL_TABLET | Freq: Three times a day (TID) | ORAL | 0 refills | Status: DC | PRN

## 2016-09-16 MED ORDER — HYDROCODONE-ACETAMINOPHEN 10-325 MG PO TABS: 1.0000 | ORAL_TABLET | Freq: Four times a day (QID) | ORAL | 0 refills | Status: DC | PRN

## 2016-09-16 MED ORDER — METHOCARBAMOL 500 MG PO TABS: 500.0000 mg | ORAL_TABLET | Freq: Three times a day (TID) | ORAL | 2 refills | Status: DC

## 2016-09-16 NOTE — Progress Notes (Signed)
BP 117/80   Pulse (!) 55   Temp 98.3 F (36.8 C) (Oral)   Ht 5\' 8"  (1.727 m)   Wt 180 lb 3.2 oz (81.7 kg)   BMI 27.40 kg/m    Subjective:    Patient ID: Cynthia Hayden, female    DOB: 1980-08-02, 36 y.o.   MRN: [id number]  HPI: STEVEN VEAZIE is a 36 y.o. female presenting on 09/16/2016 for Medication Refill  This patient comes in for periodic recheck on medications and conditions including chronic pain, joint pain, restless less, depression. New complaints in her hands and feet where she has the most hypermobility. They cramp after a lot of use, will lock in position for a few minutes and sore afterwards.  Chronic pain is under control, patient has no new complaints. Medications are keeping things stable. Needs refills for the next three months.  [place] Controlled Substance website checked and normal. Drug screen normal this year.    All medications are reviewed today. There are no reports of any problems with the medications. All of the medical conditions are reviewed and updated.  Lab work is reviewed and will be ordered as medically necessary. There are no new problems reported with today's visit.   Relevant past medical, surgical, family and social history reviewed and updated as indicated. Allergies and medications reviewed and updated.  Past Medical History:  Diagnosis Date  . Anxiety   . Arthritis   . Depression   . Hypermobility syndrome   . Migraine   . RLS (restless legs syndrome)     History reviewed. No pertinent surgical history.  Review of Systems  Allergies as of 09/16/2016      Reactions   Penicillins       Medication List       Accurate as of 09/16/16  4:32 PM. Always use your most recent med list.          albuterol 108 (90 Base) MCG/ACT inhaler Commonly known as:  PROVENTIL HFA;VENTOLIN HFA Inhale 2 puffs into the lungs every 6 (six) hours as needed for wheezing or shortness of breath.   ALPRAZolam 0.5 MG tablet Commonly known as:  XANAX TAKE  1/2 TO 1 TABLET TWICE A DAY   fluticasone furoate-vilanterol 200-25 MCG/INH Aepb Commonly known as:  BREO ELLIPTA Inhale 1 puff into the lungs daily.   HYDROcodone-acetaminophen 10-325 MG tablet Commonly known as:  NORCO Take 1 tablet by mouth every 8 (eight) hours as needed.   HYDROcodone-acetaminophen 10-325 MG tablet Commonly known as:  NORCO Take 1 tablet by mouth every 8 (eight) hours as needed.   HYDROcodone-acetaminophen 10-325 MG tablet Commonly known as:  NORCO Take 1-2 tablets by mouth every 6 (six) hours as needed.   medroxyPROGESTERone 150 MG/ML injection Commonly known as:  DEPO-PROVERA Inject 1 mL (150 mg total) into the muscle once.   meloxicam 7.5 MG tablet Commonly known as:  MOBIC Take 1 tablet (7.5 mg total) by mouth 2 (two) times daily.   methocarbamol 500 MG tablet Commonly known as:  ROBAXIN Take 1 tablet (500 mg total) by mouth 3 (three) times daily.   rOPINIRole 2 MG tablet Commonly known as:  REQUIP Take 1-2 tablets (2-4 mg total) by mouth at bedtime.   SUMAtriptan 100 MG tablet Commonly known as:  IMITREX Take 100 mg by mouth every 2 (two) hours as needed for migraine. May repeat in 2 hours if headache persists or recurs.   topiramate 100 MG tablet Commonly known as:  TOPAMAX Take 1 tablet (100 mg total) by mouth 2 (two) times daily.   venlafaxine XR 75 MG 24 hr capsule Commonly known as:  EFFEXOR-XR Take 2 capsules (150 mg total) by mouth daily with breakfast.   Vitamin D3 1000 units Caps Take by mouth.          Objective:    BP 117/80   Pulse (!) 55   Temp 98.3 F (36.8 C) (Oral)   Ht 5\' 8"  (1.727 m)   Wt 180 lb 3.2 oz (81.7 kg)   BMI 27.40 kg/m   Allergies  Allergen Reactions  . Penicillins     Physical Exam  Results for orders placed or performed in visit on 06/26/16  Beta HCG, Quant  Result Value Ref Range   hCG Quant <1 mIU/mL      Assessment & Plan:   1. Arthralgia of lower leg, unspecified laterality -  HYDROcodone-acetaminophen (NORCO) 10-325 MG tablet; Take 1 tablet by mouth every 8 (eight) hours as needed.  Dispense: 240 tablet; Refill: 0 - HYDROcodone-acetaminophen (NORCO) 10-325 MG tablet; Take 1 tablet by mouth every 8 (eight) hours as needed.  Dispense: 240 tablet; Refill: 0 - HYDROcodone-acetaminophen (NORCO) 10-325 MG tablet; Take 1-2 tablets by mouth every 6 (six) hours as needed.  Dispense: 240 tablet; Refill: 0  2. Benign joint hypermobility syndrome - HYDROcodone-acetaminophen (NORCO) 10-325 MG tablet; Take 1 tablet by mouth every 8 (eight) hours as needed.  Dispense: 240 tablet; Refill: 0 - HYDROcodone-acetaminophen (NORCO) 10-325 MG tablet; Take 1 tablet by mouth every 8 (eight) hours as needed.  Dispense: 240 tablet; Refill: 0 - HYDROcodone-acetaminophen (NORCO) 10-325 MG tablet; Take 1-2 tablets by mouth every 6 (six) hours as needed.  Dispense: 240 tablet; Refill: 0  3. Cramp of both lower extremities - methocarbamol (ROBAXIN) 500 MG tablet; Take 1 tablet (500 mg total) by mouth 3 (three) times daily.  Dispense: 90 tablet; Refill: 2  4. Cramp in limb - methocarbamol (ROBAXIN) 500 MG tablet; Take 1 tablet (500 mg total) by mouth 3 (three) times daily.  Dispense: 90 tablet; Refill: 2  5. Restless legs   Current Outpatient Prescriptions:  .  albuterol (PROVENTIL HFA;VENTOLIN HFA) 108 (90 Base) MCG/ACT inhaler, Inhale 2 puffs into the lungs every 6 (six) hours as needed for wheezing or shortness of breath., Disp: 1 Inhaler, Rfl: 0 .  ALPRAZolam (XANAX) 0.5 MG tablet, TAKE 1/2 TO 1 TABLET TWICE A DAY, Disp: 60 tablet, Rfl: 1 .  Cholecalciferol (VITAMIN D3) 1000 units CAPS, Take by mouth., Disp: , Rfl:  .  fluticasone furoate-vilanterol (BREO ELLIPTA) 200-25 MCG/INH AEPB, Inhale 1 puff into the lungs daily., Disp: 60 each, Rfl: 11 .  HYDROcodone-acetaminophen (NORCO) 10-325 MG tablet, Take 1 tablet by mouth every 8 (eight) hours as needed., Disp: 240 tablet, Rfl: 0 .   HYDROcodone-acetaminophen (NORCO) 10-325 MG tablet, Take 1 tablet by mouth every 8 (eight) hours as needed., Disp: 240 tablet, Rfl: 0 .  HYDROcodone-acetaminophen (NORCO) 10-325 MG tablet, Take 1-2 tablets by mouth every 6 (six) hours as needed., Disp: 240 tablet, Rfl: 0 .  medroxyPROGESTERone (DEPO-PROVERA) 150 MG/ML injection, Inject 1 mL (150 mg total) into the muscle once., Disp: 1 mL, Rfl: 2 .  meloxicam (MOBIC) 7.5 MG tablet, Take 1 tablet (7.5 mg total) by mouth 2 (two) times daily., Disp: 180 tablet, Rfl: 3 .  rOPINIRole (REQUIP) 2 MG tablet, Take 1-2 tablets (2-4 mg total) by mouth at bedtime., Disp: 180 tablet, Rfl: 3 .  SUMAtriptan (IMITREX) 100 MG tablet, Take 100 mg by mouth every 2 (two) hours as needed for migraine. May repeat in 2 hours if headache persists or recurs., Disp: , Rfl:  .  topiramate (TOPAMAX) 100 MG tablet, Take 1 tablet (100 mg total) by mouth 2 (two) times daily., Disp: 180 tablet, Rfl: 3 .  venlafaxine XR (EFFEXOR-XR) 75 MG 24 hr capsule, Take 2 capsules (150 mg total) by mouth daily with breakfast., Disp: 180 capsule, Rfl: 3 .  methocarbamol (ROBAXIN) 500 MG tablet, Take 1 tablet (500 mg total) by mouth 3 (three) times daily., Disp: 90 tablet, Rfl: 2  Current Facility-Administered Medications:  .  medroxyPROGESTERone (DEPO-PROVERA) injection 150 mg, 150 mg, Intramuscular, Q90 days, Shandi Godfrey S, PA-C, 150 mg at 06/28/16 1633  Continue all other maintenance medications as listed above.  Follow up plan: Return in about 3 months (around 12/17/2016) for recheck.  Educational handout given for survey  Remus LofflerAngel S. Shyler Hamill PA-C [facility] [address]  [place], [place] [id number] [phone]   09/16/2016, 4:32 PM

## 2016-09-16 NOTE — Patient Instructions (Signed)
In a few days you may receive a survey in the mail or online from [employer] regarding your visit with us today. Please take a moment to fill this out. Your feedback is very important to our whole office. It can help us better understand your needs as well as improve your experience and satisfaction. Thank you for taking your time to complete it. We care about you.  Shamell Suarez, PA-C  

## 2016-09-26 ENCOUNTER — Telehealth: Payer: Self-pay | Admitting: Physician Assistant

## 2016-09-26 NOTE — Telephone Encounter (Signed)
Pt has prod cough (clear) worse at night Denies SOB or fever Pt is using inhalers Pt is a smoker Pt instructed to try Mucinex and Delsym, drink plenty of fluids Pt also instructed to come in for evaluation if worsening cough, fever or SOB Pt verbalizes understanding

## 2016-10-03 ENCOUNTER — Other Ambulatory Visit: Payer: Self-pay | Admitting: Physician Assistant

## 2016-10-04 NOTE — Telephone Encounter (Signed)
Rx called in to pharmacy. 

## 2016-10-04 NOTE — Telephone Encounter (Signed)
Last seen 09/16/16  Cynthia KobusAngel  If approved route to nurse to call into [facility]

## 2016-12-02 ENCOUNTER — Ambulatory Visit (INDEPENDENT_AMBULATORY_CARE_PROVIDER_SITE_OTHER): Payer: BLUE CROSS/BLUE SHIELD | Admitting: Physician Assistant

## 2016-12-02 ENCOUNTER — Encounter: Payer: Self-pay | Admitting: Physician Assistant

## 2016-12-02 VITALS — BP 122/79 | HR 61 | Temp 97.9°F | Ht 68.0 in | Wt 176.0 lb

## 2016-12-02 DIAGNOSIS — Z23 Encounter for immunization: Secondary | ICD-10-CM

## 2016-12-02 DIAGNOSIS — M357 Hypermobility syndrome: Secondary | ICD-10-CM | POA: Diagnosis not present

## 2016-12-02 DIAGNOSIS — G2581 Restless legs syndrome: Secondary | ICD-10-CM | POA: Diagnosis not present

## 2016-12-02 DIAGNOSIS — M25569 Pain in unspecified knee: Secondary | ICD-10-CM

## 2016-12-02 DIAGNOSIS — J410 Simple chronic bronchitis: Secondary | ICD-10-CM | POA: Diagnosis not present

## 2016-12-02 MED ORDER — ROPINIROLE HCL 2 MG PO TABS: 2.0000 mg | ORAL_TABLET | Freq: Every day | ORAL | 3 refills | Status: DC

## 2016-12-02 MED ORDER — ALPRAZOLAM 0.5 MG PO TABS: 0.5000 mg | ORAL_TABLET | Freq: Two times a day (BID) | ORAL | 2 refills | Status: DC | PRN

## 2016-12-02 MED ORDER — HYDROCODONE-ACETAMINOPHEN 10-325 MG PO TABS: 1.0000 | ORAL_TABLET | Freq: Four times a day (QID) | ORAL | 0 refills | Status: DC | PRN

## 2016-12-02 MED ORDER — HYDROCODONE-ACETAMINOPHEN 10-325 MG PO TABS: 1.0000 | ORAL_TABLET | Freq: Three times a day (TID) | ORAL | 0 refills | Status: DC | PRN

## 2016-12-02 MED ORDER — VENLAFAXINE HCL ER 75 MG PO CP24: 150.0000 mg | ORAL_CAPSULE | Freq: Every day | ORAL | 3 refills | Status: DC

## 2016-12-02 NOTE — Progress Notes (Signed)
BP 122/79   Pulse 61   Temp 97.9 F (36.6 C) (Oral)   Ht  (1.727 m)   Wt 176 lb (79.8 kg)   BMI 26.76 kg/m    Subjective:    Patient ID: Cynthia Hayden, female    DOB: March 24, 1980, 36 y.o.   MRN: [id number]  HPI: Cynthia Hayden is a 36 y.o. female presenting on 12/02/2016 for Medication Refill  This patient comes in for periodic recheck on medications and conditions including Joint hypermobility syndrome with muscle spasms, arthralgia, COPD.  Chronic pain is under control, patient has no new complaints. Medications are keeping things stable. Needs refills for the next three months.  [place] Controlled Substance website checked and normal. Drug screen normal this year.  All medications are reviewed today. There are no reports of any problems with the medications. All of the medical conditions are reviewed and updated.  Lab work is reviewed and will be ordered as medically necessary. There are no new problems reported with today's visit.   Relevant past medical, surgical, family and social history reviewed and updated as indicated. Allergies and medications reviewed and updated.  Past Medical History:  Diagnosis Date  . Anxiety   . Arthritis   . Depression   . Hypermobility syndrome   . Migraine   . RLS (restless legs syndrome)     History reviewed. No pertinent surgical history.  Review of Systems  Constitutional: Negative.  Negative for activity change, fatigue and fever.  HENT: Negative.   Eyes: Negative.   Respiratory: Negative.  Negative for cough.   Cardiovascular: Negative.  Negative for chest pain.  Gastrointestinal: Negative.  Negative for abdominal pain.  Endocrine: Negative.   Genitourinary: Negative.  Negative for dysuria.  Musculoskeletal: Positive for arthralgias, back pain and myalgias.  Skin: Negative.   Neurological: Negative.     Allergies as of 12/02/2016      Reactions   Penicillins       Medication List       Accurate as of 12/02/16 11:02 AM.  Always use your most recent med list.          albuterol 108 (90 Base) MCG/ACT inhaler Commonly known as:  PROVENTIL HFA;VENTOLIN HFA Inhale 2 puffs into the lungs every 6 (six) hours as needed for wheezing or shortness of breath.   ALPRAZolam 0.5 MG tablet Commonly known as:  XANAX Take 1 tablet (0.5 mg total) by mouth 2 (two) times daily as needed for anxiety.   fluticasone furoate-vilanterol 200-25 MCG/INH Aepb Commonly known as:  BREO ELLIPTA Inhale 1 puff into the lungs daily.   HYDROcodone-acetaminophen 10-325 MG tablet Commonly known as:  NORCO Take 1 tablet by mouth every 8 (eight) hours as needed.   HYDROcodone-acetaminophen 10-325 MG tablet Commonly known as:  NORCO Take 1 tablet by mouth every 8 (eight) hours as needed.   HYDROcodone-acetaminophen 10-325 MG tablet Commonly known as:  NORCO Take 1-2 tablets by mouth every 6 (six) hours as needed.   medroxyPROGESTERone 150 MG/ML injection Commonly known as:  DEPO-PROVERA Inject 1 mL (150 mg total) into the muscle once.   meloxicam 7.5 MG tablet Commonly known as:  MOBIC Take 1 tablet (7.5 mg total) by mouth 2 (two) times daily.   methocarbamol 500 MG tablet Commonly known as:  ROBAXIN Take 1 tablet (500 mg total) by mouth 3 (three) times daily.   rOPINIRole 2 MG tablet Commonly known as:  REQUIP Take 1-2 tablets (2-4 mg total) by mouth  at bedtime.   SUMAtriptan 100 MG tablet Commonly known as:  IMITREX Take 100 mg by mouth every 2 (two) hours as needed for migraine. May repeat in 2 hours if headache persists or recurs.   topiramate 100 MG tablet Commonly known as:  TOPAMAX Take 1 tablet (100 mg total) by mouth 2 (two) times daily.   venlafaxine XR 75 MG 24 hr capsule Commonly known as:  EFFEXOR-XR Take 2 capsules (150 mg total) by mouth daily with breakfast.   Vitamin D3 1000 units Caps Take by mouth.          Objective:    BP 122/79   Pulse 61   Temp 97.9 F (36.6 C) (Oral)   Ht   (1.727 m)   Wt 176 lb (79.8 kg)   BMI 26.76 kg/m   Allergies  Allergen Reactions  . Penicillins     Physical Exam  Constitutional: She is oriented to person, place, and time. She appears well-developed and well-nourished.  HENT:  Head: Normocephalic and atraumatic.  Eyes: Pupils are equal, round, and reactive to light. Conjunctivae and EOM are normal.  Cardiovascular: Normal rate, regular rhythm, normal heart sounds and intact distal pulses.   Pulmonary/Chest: Effort normal and breath sounds normal.  Abdominal: Soft. Bowel sounds are normal.  Neurological: She is alert and oriented to person, place, and time. She has normal reflexes.  Skin: Skin is warm and dry. No rash noted.  Psychiatric: She has a normal mood and affect. Her behavior is normal. Judgment and thought content normal.    Results for orders placed or performed in visit on 06/26/16  Beta HCG, Quant  Result Value Ref Range   hCG Quant <1 mIU/mL      Assessment & Plan:   1. Arthralgia of lower leg, unspecified laterality - HYDROcodone-acetaminophen (NORCO) 10-325 MG tablet; Take 1 tablet by mouth every 8 (eight) hours as needed.  Dispense: 240 tablet; Refill: 0 - HYDROcodone-acetaminophen (NORCO) 10-325 MG tablet; Take 1 tablet by mouth every 8 (eight) hours as needed.  Dispense: 240 tablet; Refill: 0 - HYDROcodone-acetaminophen (NORCO) 10-325 MG tablet; Take 1-2 tablets by mouth every 6 (six) hours as needed.  Dispense: 240 tablet; Refill: 0  2. Benign joint hypermobility syndrome - HYDROcodone-acetaminophen (NORCO) 10-325 MG tablet; Take 1 tablet by mouth every 8 (eight) hours as needed.  Dispense: 240 tablet; Refill: 0 - HYDROcodone-acetaminophen (NORCO) 10-325 MG tablet; Take 1 tablet by mouth every 8 (eight) hours as needed.  Dispense: 240 tablet; Refill: 0 - HYDROcodone-acetaminophen (NORCO) 10-325 MG tablet; Take 1-2 tablets by mouth every 6 (six) hours as needed.  Dispense: 240 tablet; Refill: 0  3.  Restless legs - rOPINIRole (REQUIP) 2 MG tablet; Take 1-2 tablets (2-4 mg total) by mouth at bedtime.  Dispense: 180 tablet; Refill: 3  4. Simple chronic bronchitis (HCC) - Pneumococcal polysaccharide vaccine 23-valent greater than or equal to 2yo subcutaneous/IM  5. Need for immunization against influenza - Flu Vaccine QUAD 36+ mos IM    Current Outpatient Prescriptions:  .  albuterol (PROVENTIL HFA;VENTOLIN HFA) 108 (90 Base) MCG/ACT inhaler, Inhale 2 puffs into the lungs every 6 (six) hours as needed for wheezing or shortness of breath., Disp: 1 Inhaler, Rfl: 0 .  ALPRAZolam (XANAX) 0.5 MG tablet, Take 1 tablet (0.5 mg total) by mouth 2 (two) times daily as needed for anxiety., Disp: 60 tablet, Rfl: 2 .  Cholecalciferol (VITAMIN D3) 1000 units CAPS, Take by mouth., Disp: , Rfl:  .  fluticasone furoate-vilanterol (BREO ELLIPTA) 200-25 MCG/INH AEPB, Inhale 1 puff into the lungs daily., Disp: 60 each, Rfl: 11 .  HYDROcodone-acetaminophen (NORCO) 10-325 MG tablet, Take 1 tablet by mouth every 8 (eight) hours as needed., Disp: 240 tablet, Rfl: 0 .  HYDROcodone-acetaminophen (NORCO) 10-325 MG tablet, Take 1 tablet by mouth every 8 (eight) hours as needed., Disp: 240 tablet, Rfl: 0 .  HYDROcodone-acetaminophen (NORCO) 10-325 MG tablet, Take 1-2 tablets by mouth every 6 (six) hours as needed., Disp: 240 tablet, Rfl: 0 .  medroxyPROGESTERone (DEPO-PROVERA) 150 MG/ML injection, Inject 1 mL (150 mg total) into the muscle once., Disp: 1 mL, Rfl: 2 .  meloxicam (MOBIC) 7.5 MG tablet, Take 1 tablet (7.5 mg total) by mouth 2 (two) times daily., Disp: 180 tablet, Rfl: 3 .  methocarbamol (ROBAXIN) 500 MG tablet, Take 1 tablet (500 mg total) by mouth 3 (three) times daily., Disp: 90 tablet, Rfl: 2 .  rOPINIRole (REQUIP) 2 MG tablet, Take 1-2 tablets (2-4 mg total) by mouth at bedtime., Disp: 180 tablet, Rfl: 3 .  SUMAtriptan (IMITREX) 100 MG tablet, Take 100 mg by mouth every 2 (two) hours as needed for  migraine. May repeat in 2 hours if headache persists or recurs., Disp: , Rfl:  .  topiramate (TOPAMAX) 100 MG tablet, Take 1 tablet (100 mg total) by mouth 2 (two) times daily., Disp: 180 tablet, Rfl: 3 .  venlafaxine XR (EFFEXOR-XR) 75 MG 24 hr capsule, Take 2 capsules (150 mg total) by mouth daily with breakfast., Disp: 180 capsule, Rfl: 3  Current Facility-Administered Medications:  .  medroxyPROGESTERone (DEPO-PROVERA) injection 150 mg, 150 mg, Intramuscular, Q90 days, Dewanna Hurston S, PA-C, 150 mg at 06/28/16 1633 Continue all other maintenance medications as listed above.  Follow up plan: Return in about 3 months (around 03/04/2017) for recheck.  Educational handout given for survey  Remus Loffler PA-C [facility] [address]  [place], [place] [phone]   12/02/2016, 11:02 AM

## 2016-12-02 NOTE — Patient Instructions (Signed)
In a few days you may receive a survey in the mail or online from [employer] regarding your visit with us today. Please take a moment to fill this out. Your feedback is very important to our whole office. It can help us better understand your needs as well as improve your experience and satisfaction. Thank you for taking your time to complete it. We care about you.  Tashiba Timoney, PA-C  

## 2016-12-10 ENCOUNTER — Ambulatory Visit: Payer: BLUE CROSS/BLUE SHIELD

## 2017-01-28 ENCOUNTER — Telehealth: Payer: Self-pay | Admitting: Physician Assistant

## 2017-01-28 NOTE — Telephone Encounter (Signed)
Please bring the information with her.  She has an appointment on December 20.

## 2017-01-28 NOTE — Telephone Encounter (Signed)
Patient aware to bring information with her to appt on 12/20 with Prudy FeelerAngel Jones

## 2017-02-13 ENCOUNTER — Encounter: Payer: Self-pay | Admitting: Physician Assistant

## 2017-02-13 ENCOUNTER — Ambulatory Visit: Payer: BLUE CROSS/BLUE SHIELD | Admitting: Physician Assistant

## 2017-02-13 VITALS — BP 118/77 | HR 79 | Temp 97.9°F | Ht 68.0 in | Wt 180.0 lb

## 2017-02-13 DIAGNOSIS — L84 Corns and callosities: Secondary | ICD-10-CM

## 2017-02-13 DIAGNOSIS — M25569 Pain in unspecified knee: Secondary | ICD-10-CM | POA: Diagnosis not present

## 2017-02-13 DIAGNOSIS — M357 Hypermobility syndrome: Secondary | ICD-10-CM

## 2017-02-13 DIAGNOSIS — J411 Mucopurulent chronic bronchitis: Secondary | ICD-10-CM

## 2017-02-13 MED ORDER — HYDROCODONE-ACETAMINOPHEN 10-325 MG PO TABS: 1.0000 | ORAL_TABLET | Freq: Three times a day (TID) | ORAL | 0 refills | Status: DC | PRN

## 2017-02-13 NOTE — Patient Instructions (Signed)
In a few days you may receive a survey in the mail or online from [employer] regarding your visit with us today. Please take a moment to fill this out. Your feedback is very important to our whole office. It can help us better understand your needs as well as improve your experience and satisfaction. Thank you for taking your time to complete it. We care about you.  Myeesha Shane, PA-C  

## 2017-02-18 NOTE — Progress Notes (Signed)
BP 118/77   Pulse 79   Temp 97.9 F (36.6 C) (Oral)   Ht 5\' 8"  (1.727 m)   Wt 180 lb (81.6 kg)   BMI 27.37 kg/m    Subjective:    Patient ID: Cynthia Hayden, female    DOB: January 03, 1981, 36 y.o.   MRN: [id number]  HPI: Cynthia Sabinsosha K Slaubaugh is a 36 y.o. female presenting on 02/13/2017 for Follow-up (3 month)  Chronic pain is under control, patient has no new complaints. Medications are keeping things stable. Needs refills for the next three months.  [place] Controlled Substance website checked and normal. Drug screen normal this year.  Patient with several days of progressing upper respiratory and bronchial symptoms. Initially there was more upper respiratory congestion. This progressed to having significant cough that is productive throughout the day and severe at night. There is occasional wheezing after coughing. Sometimes there is slight dyspnea on exertion. It is productive mucus that is yellow in color. Denies any blood.  Also pain in right toe 2 and 3.   She has deformity that is causing her toes to constantly rub and now forming callus, no skin breakdown.  Relevant past medical, surgical, family and social history reviewed and updated as indicated. Allergies and medications reviewed and updated.  Past Medical History:  Diagnosis Date  . Anxiety   . Arthritis   . Depression   . Hypermobility syndrome   . Migraine   . RLS (restless legs syndrome)     History reviewed. No pertinent surgical history.  Review of Systems  Constitutional: Negative.  Negative for activity change, fatigue and fever.  HENT: Negative.   Eyes: Negative.   Respiratory: Negative.  Negative for cough.   Cardiovascular: Negative.  Negative for chest pain.  Gastrointestinal: Negative.  Negative for abdominal pain.  Endocrine: Negative.   Genitourinary: Negative.  Negative for dysuria.  Musculoskeletal: Positive for arthralgias, joint swelling, neck pain and neck stiffness.  Skin: Negative.   Neurological:  Negative.     Allergies as of 02/13/2017      Reactions   Penicillins       Medication List        Accurate as of 02/13/17 11:59 PM. Always use your most recent med list.          albuterol 108 (90 Base) MCG/ACT inhaler Commonly known as:  PROVENTIL HFA;VENTOLIN HFA Inhale 2 puffs into the lungs every 6 (six) hours as needed for wheezing or shortness of breath.   ALPRAZolam 0.5 MG tablet Commonly known as:  XANAX Take 1 tablet (0.5 mg total) by mouth 2 (two) times daily as needed for anxiety.   fluticasone furoate-vilanterol 200-25 MCG/INH Aepb Commonly known as:  BREO ELLIPTA Inhale 1 puff into the lungs daily.   HYDROcodone-acetaminophen 10-325 MG tablet Commonly known as:  NORCO Take 1-2 tablets by mouth every 8 (eight) hours as needed.   HYDROcodone-acetaminophen 10-325 MG tablet Commonly known as:  NORCO Take 1-2 tablets by mouth every 8 (eight) hours as needed.   HYDROcodone-acetaminophen 10-325 MG tablet Commonly known as:  NORCO Take 1-2 tablets by mouth every 8 (eight) hours as needed for moderate pain.   medroxyPROGESTERone 150 MG/ML injection Commonly known as:  DEPO-PROVERA Inject 1 mL (150 mg total) into the muscle once.   meloxicam 7.5 MG tablet Commonly known as:  MOBIC Take 1 tablet (7.5 mg total) by mouth 2 (two) times daily.   methocarbamol 500 MG tablet Commonly known as:  ROBAXIN Take 1  tablet (500 mg total) by mouth 3 (three) times daily.   rOPINIRole 2 MG tablet Commonly known as:  REQUIP Take 1-2 tablets (2-4 mg total) by mouth at bedtime.   SUMAtriptan 100 MG tablet Commonly known as:  IMITREX Take 100 mg by mouth every 2 (two) hours as needed for migraine. May repeat in 2 hours if headache persists or recurs.   topiramate 100 MG tablet Commonly known as:  TOPAMAX Take 1 tablet (100 mg total) by mouth 2 (two) times daily.   venlafaxine XR 75 MG 24 hr capsule Commonly known as:  EFFEXOR-XR Take 2 capsules (150 mg total) by mouth  daily with breakfast.   Vitamin D3 1000 units Caps Take by mouth.          Objective:    BP 118/77   Pulse 79   Temp 97.9 F (36.6 C) (Oral)   Ht 5\' 8"  (1.727 m)   Wt 180 lb (81.6 kg)   BMI 27.37 kg/m   Allergies  Allergen Reactions  . Penicillins     Physical Exam  Constitutional: She is oriented to person, place, and time. She appears well-developed and well-nourished.  HENT:  Head: Normocephalic and atraumatic.  Right Ear: Tympanic membrane, external ear and ear canal normal.  Left Ear: Tympanic membrane, external ear and ear canal normal.  Nose: Nose normal. No rhinorrhea.  Mouth/Throat: Oropharynx is clear and moist and mucous membranes are normal. No oropharyngeal exudate or posterior oropharyngeal erythema.  Eyes: Conjunctivae and EOM are normal. Pupils are equal, round, and reactive to light.  Neck: Normal range of motion. Neck supple.  Cardiovascular: Normal rate, regular rhythm, normal heart sounds and intact distal pulses.  Pulmonary/Chest: Effort normal and breath sounds normal.  Abdominal: Soft. Bowel sounds are normal.  Neurological: She is alert and oriented to person, place, and time. She has normal reflexes.  Skin: Skin is warm and dry. No rash noted.  Psychiatric: She has a normal mood and affect. Her behavior is normal. Judgment and thought content normal.  Nursing note and vitals reviewed.       Assessment & Plan:   1. Arthralgia of lower leg, unspecified laterality - HYDROcodone-acetaminophen (NORCO) 10-325 MG tablet; Take 1-2 tablets by mouth every 8 (eight) hours as needed.  Dispense: 180 tablet; Refill: 0 - HYDROcodone-acetaminophen (NORCO) 10-325 MG tablet; Take 1-2 tablets by mouth every 8 (eight) hours as needed.  Dispense: 180 tablet; Refill: 0 - HYDROcodone-acetaminophen (NORCO) 10-325 MG tablet; Take 1-2 tablets by mouth every 8 (eight) hours as needed for moderate pain.  Dispense: 180 tablet; Refill: 0  2. Benign joint hypermobility  syndrome - HYDROcodone-acetaminophen (NORCO) 10-325 MG tablet; Take 1-2 tablets by mouth every 8 (eight) hours as needed.  Dispense: 180 tablet; Refill: 0 - HYDROcodone-acetaminophen (NORCO) 10-325 MG tablet; Take 1-2 tablets by mouth every 8 (eight) hours as needed.  Dispense: 180 tablet; Refill: 0 - HYDROcodone-acetaminophen (NORCO) 10-325 MG tablet; Take 1-2 tablets by mouth every 8 (eight) hours as needed for moderate pain.  Dispense: 180 tablet; Refill: 0  3. Mucopurulent chronic bronchitis (HCC) - Ambulatory referral to Pulmonology  4. Corn or callus - Ambulatory referral to Podiatry    Current Outpatient Medications:  .  albuterol (PROVENTIL HFA;VENTOLIN HFA) 108 (90 Base) MCG/ACT inhaler, Inhale 2 puffs into the lungs every 6 (six) hours as needed for wheezing or shortness of breath., Disp: 1 Inhaler, Rfl: 0 .  ALPRAZolam (XANAX) 0.5 MG tablet, Take 1 tablet (0.5 mg total) by  mouth 2 (two) times daily as needed for anxiety., Disp: 60 tablet, Rfl: 2 .  Cholecalciferol (VITAMIN D3) 1000 units CAPS, Take by mouth., Disp: , Rfl:  .  fluticasone furoate-vilanterol (BREO ELLIPTA) 200-25 MCG/INH AEPB, Inhale 1 puff into the lungs daily., Disp: 60 each, Rfl: 11 .  HYDROcodone-acetaminophen (NORCO) 10-325 MG tablet, Take 1-2 tablets by mouth every 8 (eight) hours as needed., Disp: 180 tablet, Rfl: 0 .  HYDROcodone-acetaminophen (NORCO) 10-325 MG tablet, Take 1-2 tablets by mouth every 8 (eight) hours as needed., Disp: 180 tablet, Rfl: 0 .  HYDROcodone-acetaminophen (NORCO) 10-325 MG tablet, Take 1-2 tablets by mouth every 8 (eight) hours as needed for moderate pain., Disp: 180 tablet, Rfl: 0 .  meloxicam (MOBIC) 7.5 MG tablet, Take 1 tablet (7.5 mg total) by mouth 2 (two) times daily., Disp: 180 tablet, Rfl: 3 .  methocarbamol (ROBAXIN) 500 MG tablet, Take 1 tablet (500 mg total) by mouth 3 (three) times daily., Disp: 90 tablet, Rfl: 2 .  rOPINIRole (REQUIP) 2 MG tablet, Take 1-2 tablets (2-4  mg total) by mouth at bedtime., Disp: 180 tablet, Rfl: 3 .  SUMAtriptan (IMITREX) 100 MG tablet, Take 100 mg by mouth every 2 (two) hours as needed for migraine. May repeat in 2 hours if headache persists or recurs., Disp: , Rfl:  .  topiramate (TOPAMAX) 100 MG tablet, Take 1 tablet (100 mg total) by mouth 2 (two) times daily., Disp: 180 tablet, Rfl: 3 .  venlafaxine XR (EFFEXOR-XR) 75 MG 24 hr capsule, Take 2 capsules (150 mg total) by mouth daily with breakfast., Disp: 180 capsule, Rfl: 3 .  medroxyPROGESTERone (DEPO-PROVERA) 150 MG/ML injection, Inject 1 mL (150 mg total) into the muscle once., Disp: 1 mL, Rfl: 2  Current Facility-Administered Medications:  .  medroxyPROGESTERone (DEPO-PROVERA) injection 150 mg, 150 mg, Intramuscular, Q90 days, Safiyyah Vasconez S, PA-C, 150 mg at 06/28/16 1633 Continue all other maintenance medications as listed above.  Follow up plan: Return in about 3 months (around 05/14/2017) for recheck.  Educational handout given for survey  Remus Loffler PA-C [facility] [address]  [place], [place] [address] [phone]   02/18/2017, 9:46 PM

## 2017-03-13 DIAGNOSIS — M79674 Pain in right toe(s): Secondary | ICD-10-CM | POA: Diagnosis not present

## 2017-03-13 DIAGNOSIS — M2041 Other hammer toe(s) (acquired), right foot: Secondary | ICD-10-CM | POA: Diagnosis not present

## 2017-03-19 ENCOUNTER — Other Ambulatory Visit: Payer: Self-pay | Admitting: Physician Assistant

## 2017-03-19 DIAGNOSIS — J411 Mucopurulent chronic bronchitis: Secondary | ICD-10-CM

## 2017-03-19 DIAGNOSIS — R058 Other specified cough: Secondary | ICD-10-CM

## 2017-03-19 DIAGNOSIS — R05 Cough: Secondary | ICD-10-CM

## 2017-03-24 DIAGNOSIS — G2581 Restless legs syndrome: Secondary | ICD-10-CM | POA: Diagnosis not present

## 2017-03-24 DIAGNOSIS — F419 Anxiety disorder, unspecified: Secondary | ICD-10-CM | POA: Diagnosis not present

## 2017-03-24 DIAGNOSIS — J411 Mucopurulent chronic bronchitis: Secondary | ICD-10-CM | POA: Diagnosis not present

## 2017-03-25 DIAGNOSIS — M79674 Pain in right toe(s): Secondary | ICD-10-CM | POA: Diagnosis not present

## 2017-03-25 DIAGNOSIS — L03032 Cellulitis of left toe: Secondary | ICD-10-CM | POA: Diagnosis not present

## 2017-03-25 DIAGNOSIS — L03031 Cellulitis of right toe: Secondary | ICD-10-CM | POA: Diagnosis not present

## 2017-03-25 DIAGNOSIS — M79675 Pain in left toe(s): Secondary | ICD-10-CM | POA: Diagnosis not present

## 2017-03-26 ENCOUNTER — Other Ambulatory Visit (HOSPITAL_COMMUNITY): Payer: Self-pay | Admitting: Respiratory Therapy

## 2017-03-26 DIAGNOSIS — R059 Cough, unspecified: Secondary | ICD-10-CM

## 2017-03-26 DIAGNOSIS — R05 Cough: Secondary | ICD-10-CM

## 2017-04-08 ENCOUNTER — Ambulatory Visit (HOSPITAL_COMMUNITY)
Admission: RE | Admit: 2017-04-08 | Discharge: 2017-04-08 | Disposition: A | Discharge status: Home / Self Care | Payer: Self-pay | Source: Ambulatory Visit | Attending: Pulmonary Disease | Admitting: Pulmonary Disease

## 2017-04-08 ENCOUNTER — Other Ambulatory Visit (HOSPITAL_COMMUNITY): Payer: Self-pay | Admitting: Pulmonary Disease

## 2017-04-08 DIAGNOSIS — R918 Other nonspecific abnormal finding of lung field: Secondary | ICD-10-CM | POA: Insufficient documentation

## 2017-04-08 DIAGNOSIS — J40 Bronchitis, not specified as acute or chronic: Secondary | ICD-10-CM

## 2017-04-08 DIAGNOSIS — R05 Cough: Secondary | ICD-10-CM | POA: Insufficient documentation

## 2017-04-08 DIAGNOSIS — R059 Cough, unspecified: Secondary | ICD-10-CM

## 2017-04-08 DIAGNOSIS — L03032 Cellulitis of left toe: Secondary | ICD-10-CM | POA: Diagnosis not present

## 2017-04-08 LAB — PULMONARY FUNCTION TEST
DL/VA % pred: 59 %
DL/VA: 3.14 ml/min/mmHg/L
DLCO unc % pred: 60 %
DLCO unc: 17.91 ml/min/mmHg
FEF 25-75 Post: 2.32 L/sec
FEF 25-75 Pre: 1.71 L/sec
FEF2575-%Change-Post: 35 %
FEF2575-%Pred-Post: 65 %
FEF2575-%Pred-Pre: 47 %
FEV1-%Change-Post: 8 %
FEV1-%Pred-Post: 85 %
FEV1-%Pred-Pre: 78 %
FEV1-Post: 2.99 L
FEV1-Pre: 2.77 L
FEV1FVC-%Change-Post: 8 %
FEV1FVC-%Pred-Pre: 78 %
FEV6-%Change-Post: 0 %
FEV6-%Pred-Post: 98 %
FEV6-%Pred-Pre: 97 %
FEV6-Post: 4.12 L
FEV6-Pre: 4.1 L
FEV6FVC-%Change-Post: 1 %
FEV6FVC-%Pred-Post: 100 %
FEV6FVC-%Pred-Pre: 98 %
FVC-%Change-Post: 0 %
FVC-%Pred-Post: 97 %
FVC-%Pred-Pre: 98 %
FVC-Post: 4.15 L
FVC-Pre: 4.18 L
Post FEV1/FVC ratio: 72 %
Post FEV6/FVC ratio: 99 %
Pre FEV1/FVC ratio: 66 %
Pre FEV6/FVC Ratio: 98 %
RV % pred: 161 %
RV: 2.7 L
TLC % pred: 120 %
TLC: 6.79 L

## 2017-04-08 MED ORDER — ALBUTEROL SULFATE (2.5 MG/3ML) 0.083% IN NEBU: 2.5000 mg | INHALATION_SOLUTION | Freq: Once | RESPIRATORY_TRACT | Status: DC

## 2017-05-02 DIAGNOSIS — J449 Chronic obstructive pulmonary disease, unspecified: Secondary | ICD-10-CM | POA: Diagnosis not present

## 2017-05-02 DIAGNOSIS — G2581 Restless legs syndrome: Secondary | ICD-10-CM | POA: Diagnosis not present

## 2017-05-02 DIAGNOSIS — F419 Anxiety disorder, unspecified: Secondary | ICD-10-CM | POA: Diagnosis not present

## 2017-05-07 DIAGNOSIS — J449 Chronic obstructive pulmonary disease, unspecified: Secondary | ICD-10-CM | POA: Diagnosis not present

## 2017-05-07 DIAGNOSIS — F419 Anxiety disorder, unspecified: Secondary | ICD-10-CM | POA: Diagnosis not present

## 2017-05-16 ENCOUNTER — Other Ambulatory Visit: Payer: Self-pay | Admitting: Physician Assistant

## 2017-06-02 ENCOUNTER — Encounter: Payer: Self-pay | Admitting: Allergy & Immunology

## 2017-06-20 ENCOUNTER — Ambulatory Visit: Payer: BLUE CROSS/BLUE SHIELD | Admitting: Physician Assistant

## 2017-06-20 ENCOUNTER — Encounter: Payer: Self-pay | Admitting: Physician Assistant

## 2017-06-20 DIAGNOSIS — M357 Hypermobility syndrome: Secondary | ICD-10-CM | POA: Diagnosis not present

## 2017-06-20 DIAGNOSIS — M25569 Pain in unspecified knee: Secondary | ICD-10-CM

## 2017-06-20 MED ORDER — HYDROCODONE-ACETAMINOPHEN 10-325 MG PO TABS
1.0000 | ORAL_TABLET | Freq: Three times a day (TID) | ORAL | 0 refills | Status: DC | PRN
Start: 2017-06-20 — End: 2017-09-26

## 2017-06-20 MED ORDER — HYDROCODONE-ACETAMINOPHEN 10-325 MG PO TABS: 1.0000 | ORAL_TABLET | Freq: Three times a day (TID) | ORAL | 0 refills | Status: DC | PRN

## 2017-06-22 NOTE — Progress Notes (Signed)
BP 133/84   Pulse (!) 58   Temp (!) 97.3 F (36.3 C) (Oral)   Ht  (1.727 m)   Wt 186 lb 9.6 oz (84.6 kg)   SpO2 100%   BMI 28.37 kg/m    Subjective:    Patient ID: Cynthia Hayden, female    DOB: [birth year], 37 y.o.   MRN: [id number]  HPI: DONTA MCINROY is a 37 y.o. female presenting on 06/20/2017 for Follow-up (med check)  This patient comes in for 34-month recheck on her medications.  Overall she is stable with her pain medication due to her joint pain.  She is trying still to be active and do things outside with the changing of seasons.  She has seen pulmonology and has had diagnosis of COPD with asthma.  She is on a new medication and does feel like it is helping some she will be seeing the pulmonologist again soon.  Past Medical History:  Diagnosis Date  . Anxiety   . Arthritis   . Depression   . Hypermobility syndrome   . Migraine   . RLS (restless legs syndrome)    Relevant past medical, surgical, family and social history reviewed and updated as indicated. Interim medical history since our last visit reviewed. Allergies and medications reviewed and updated. DATA REVIEWED: CHART IN EPIC  Family History reviewed for pertinent findings.  Review of Systems  Constitutional: Negative.   HENT: Negative.   Eyes: Negative.   Respiratory: Negative.   Gastrointestinal: Negative.   Genitourinary: Negative.     Allergies as of 06/20/2017      Reactions   Penicillins       Medication List        Accurate as of 06/20/17 11:59 PM. Always use your most recent med list.          albuterol 108 (90 Base) MCG/ACT inhaler Commonly known as:  PROVENTIL HFA;VENTOLIN HFA Inhale 2 puffs into the lungs every 6 (six) hours as needed for wheezing or shortness of breath.   ALPRAZolam 0.5 MG tablet Commonly known as:  XANAX TAKE ONE TABLET BY MOUTH TWICE DAILY   HYDROcodone-acetaminophen 10-325 MG tablet Commonly known as:  NORCO Take 1-2 tablets by mouth every 8 (eight)  hours as needed.   HYDROcodone-acetaminophen 10-325 MG tablet Commonly known as:  NORCO Take 1-2 tablets by mouth every 8 (eight) hours as needed.   HYDROcodone-acetaminophen 10-325 MG tablet Commonly known as:  NORCO Take 1-2 tablets by mouth every 8 (eight) hours as needed for moderate pain.   meloxicam 7.5 MG tablet Commonly known as:  MOBIC Take 1 tablet (7.5 mg total) by mouth 2 (two) times daily.   methocarbamol 500 MG tablet Commonly known as:  ROBAXIN Take 1 tablet (500 mg total) by mouth 3 (three) times daily.   rOPINIRole 2 MG tablet Commonly known as:  REQUIP Take 1-2 tablets (2-4 mg total) by mouth at bedtime.   SUMAtriptan 100 MG tablet Commonly known as:  IMITREX Take 100 mg by mouth every 2 (two) hours as needed for migraine. May repeat in 2 hours if headache persists or recurs.   topiramate 100 MG tablet Commonly known as:  TOPAMAX Take 1 tablet (100 mg total) by mouth 2 (two) times daily.   TRELEGY ELLIPTA 100-62.5-25 MCG/INH Aepb Generic drug:  Fluticasone-Umeclidin-Vilant Inhale into the lungs.   venlafaxine XR 75 MG 24 hr capsule Commonly known as:  EFFEXOR-XR Take 2 capsules (150 mg total) by mouth daily with  breakfast.   Vitamin D3 1000 units Caps Take by mouth.          Objective:    BP 133/84   Pulse (!) 58   Temp (!) 97.3 F (36.3 C) (Oral)   Ht  (1.727 m)   Wt 186 lb 9.6 oz (84.6 kg)   SpO2 100%   BMI 28.37 kg/m   Allergies  Allergen Reactions  . Penicillins     Wt Readings from Last 3 Encounters:  06/20/17 186 lb 9.6 oz (84.6 kg)  02/13/17 180 lb (81.6 kg)  12/02/16 176 lb (79.8 kg)    Physical Exam  Constitutional: She is oriented to person, place, and time. She appears well-developed and well-nourished.  HENT:  Head: Normocephalic and atraumatic.  Eyes: Pupils are equal, round, and reactive to light. Conjunctivae and EOM are normal.  Cardiovascular: Normal rate, regular rhythm, normal heart sounds and intact  distal pulses.  Pulmonary/Chest: Effort normal and breath sounds normal.  Abdominal: Soft. Bowel sounds are normal.  Neurological: She is alert and oriented to person, place, and time. She has normal reflexes.  Skin: Skin is warm and dry. No rash noted.  Psychiatric: She has a normal mood and affect. Her behavior is normal. Judgment and thought content normal.        Assessment & Plan:   1. Arthralgia of lower leg, unspecified laterality - HYDROcodone-acetaminophen (NORCO) 10-325 MG tablet; Take 1-2 tablets by mouth every 8 (eight) hours as needed.  Dispense: 180 tablet; Refill: 0 - HYDROcodone-acetaminophen (NORCO) 10-325 MG tablet; Take 1-2 tablets by mouth every 8 (eight) hours as needed.  Dispense: 180 tablet; Refill: 0 - HYDROcodone-acetaminophen (NORCO) 10-325 MG tablet; Take 1-2 tablets by mouth every 8 (eight) hours as needed for moderate pain.  Dispense: 180 tablet; Refill: 0  2. Benign joint hypermobility syndrome - HYDROcodone-acetaminophen (NORCO) 10-325 MG tablet; Take 1-2 tablets by mouth every 8 (eight) hours as needed.  Dispense: 180 tablet; Refill: 0 - HYDROcodone-acetaminophen (NORCO) 10-325 MG tablet; Take 1-2 tablets by mouth every 8 (eight) hours as needed.  Dispense: 180 tablet; Refill: 0 - HYDROcodone-acetaminophen (NORCO) 10-325 MG tablet; Take 1-2 tablets by mouth every 8 (eight) hours as needed for moderate pain.  Dispense: 180 tablet; Refill: 0   Continue all other maintenance medications as listed above.  Follow up plan: Return in about 3 months (around 09/19/2017).  Educational handout given for survey  Remus Loffler PA-C [facility] [address]  [place], [place] [address] [phone]   06/22/2017, 9:21 PM

## 2017-08-30 ENCOUNTER — Other Ambulatory Visit: Payer: Self-pay | Admitting: Physician Assistant

## 2017-08-30 DIAGNOSIS — G2581 Restless legs syndrome: Secondary | ICD-10-CM

## 2017-09-15 ENCOUNTER — Ambulatory Visit: Payer: BLUE CROSS/BLUE SHIELD | Admitting: Physician Assistant

## 2017-09-17 ENCOUNTER — Other Ambulatory Visit: Payer: Self-pay | Admitting: Physician Assistant

## 2017-09-18 NOTE — Telephone Encounter (Signed)
OV 09/26/17

## 2017-09-26 ENCOUNTER — Ambulatory Visit: Payer: BLUE CROSS/BLUE SHIELD | Admitting: Physician Assistant

## 2017-09-26 ENCOUNTER — Telehealth: Payer: Self-pay | Admitting: Physician Assistant

## 2017-09-26 ENCOUNTER — Other Ambulatory Visit: Payer: Self-pay | Admitting: Physician Assistant

## 2017-09-26 DIAGNOSIS — M25569 Pain in unspecified knee: Secondary | ICD-10-CM

## 2017-09-26 DIAGNOSIS — M357 Hypermobility syndrome: Secondary | ICD-10-CM

## 2017-09-26 MED ORDER — HYDROCODONE-ACETAMINOPHEN 10-325 MG PO TABS: 1.0000 | ORAL_TABLET | Freq: Three times a day (TID) | ORAL | 0 refills | Status: DC | PRN

## 2017-09-26 NOTE — Telephone Encounter (Signed)
Returned patient's phone call.  Rescheduled patient to 10/13/17 but is out of Hydrocodone and will need a refill.   Last seen 4/26, last filled 4/26 with 2 refills

## 2017-09-26 NOTE — Telephone Encounter (Signed)
Patient aware.

## 2017-09-26 NOTE — Telephone Encounter (Signed)
sent 

## 2017-09-29 ENCOUNTER — Ambulatory Visit: Payer: BLUE CROSS/BLUE SHIELD | Admitting: Physician Assistant

## 2017-09-29 ENCOUNTER — Encounter: Payer: Self-pay | Admitting: Physician Assistant

## 2017-09-29 VITALS — BP 112/77 | HR 62 | Temp 98.5°F | Ht 68.0 in | Wt 183.6 lb

## 2017-09-29 DIAGNOSIS — M357 Hypermobility syndrome: Secondary | ICD-10-CM | POA: Diagnosis not present

## 2017-09-29 DIAGNOSIS — M25569 Pain in unspecified knee: Secondary | ICD-10-CM

## 2017-09-29 DIAGNOSIS — G43801 Other migraine, not intractable, with status migrainosus: Secondary | ICD-10-CM | POA: Diagnosis not present

## 2017-09-29 MED ORDER — TOPIRAMATE 100 MG PO TABS: 100.0000 mg | ORAL_TABLET | Freq: Two times a day (BID) | ORAL | 3 refills | Status: DC

## 2017-09-29 MED ORDER — HYDROCODONE-ACETAMINOPHEN 10-325 MG PO TABS: 1.0000 | ORAL_TABLET | Freq: Three times a day (TID) | ORAL | 0 refills | Status: DC | PRN

## 2017-09-29 MED ORDER — ALPRAZOLAM 0.5 MG PO TABS: 0.5000 mg | ORAL_TABLET | Freq: Two times a day (BID) | ORAL | 5 refills | Status: DC

## 2017-09-29 NOTE — Progress Notes (Signed)
BP 112/77   Pulse 62   Temp 98.5 F (36.9 C) (Oral)   Ht 5\' 8"  (1.727 m)   Wt 183 lb 9.6 oz (83.3 kg)   BMI 27.92 kg/m    Subjective:    Patient ID: Cynthia Hayden, female    DOB: 23-Aug-1980, 37 y.o.   MRN: [id number]  HPI: Cynthia Hayden is a 37 y.o. female presenting on 09/29/2017 for Medical Management of Chronic Issues  This patient comes in for periodic recheck on medications and conditions including migraine, headache, chronic back pain, restless legs.  All of her medications are reviewed.  She states she is doing very well with them.  She had to go back up to 2 daily Topamax.  She had reduced it to 1 and was having fairly good control but in recent weeks it has gone back up again.  She is tolerating that change well.  She is not still taking Requip.  Chronic pain is under control, patient has no new complaints. Medications are keeping things stable. Needs refills for the next three months.  [place] Controlled Substance website checked and normal. Drug screen normal this year. .   All medications are reviewed today. There are no reports of any problems with the medications. All of the medical conditions are reviewed and updated.  Lab work is reviewed and will be ordered as medically necessary. There are no new problems reported with today's visit.   Past Medical History:  Diagnosis Date  . Anxiety   . Arthritis   . Depression   . Hypermobility syndrome   . Migraine   . RLS (restless legs syndrome)    Relevant past medical, surgical, family and social history reviewed and updated as indicated. Interim medical history since our last visit reviewed. Allergies and medications reviewed and updated. DATA REVIEWED: CHART IN EPIC  Family History reviewed for pertinent findings.  Review of Systems  Constitutional: Negative.  Negative for activity change, fatigue and fever.  HENT: Negative.   Eyes: Negative.   Respiratory: Negative.  Negative for cough.   Cardiovascular: Negative.   Negative for chest pain.  Gastrointestinal: Negative.  Negative for abdominal pain.  Endocrine: Negative.   Genitourinary: Negative.  Negative for dysuria.  Musculoskeletal: Negative.   Skin: Negative.   Neurological: Negative.     Allergies as of 09/29/2017      Reactions   Penicillins       Medication List        Accurate as of 09/29/17 11:06 AM. Always use your most recent med list.          albuterol 108 (90 Base) MCG/ACT inhaler Commonly known as:  PROVENTIL HFA;VENTOLIN HFA Inhale 2 puffs into the lungs every 6 (six) hours as needed for wheezing or shortness of breath.   ALPRAZolam 0.5 MG tablet Commonly known as:  XANAX Take 1 tablet (0.5 mg total) by mouth 2 (two) times daily.   HYDROcodone-acetaminophen 10-325 MG tablet Commonly known as:  NORCO Take 1-2 tablets by mouth every 8 (eight) hours as needed.   HYDROcodone-acetaminophen 10-325 MG tablet Commonly known as:  NORCO Take 1-2 tablets by mouth every 8 (eight) hours as needed.   HYDROcodone-acetaminophen 10-325 MG tablet Commonly known as:  NORCO Take 1-2 tablets by mouth every 8 (eight) hours as needed for moderate pain.   meloxicam 7.5 MG tablet Commonly known as:  MOBIC Take 1 tablet (7.5 mg total) by mouth 2 (two) times daily.   rOPINIRole 2 MG  tablet Commonly known as:  REQUIP TAKE 1 TO 2 TABLETS AT BEDTIME   SUMAtriptan 100 MG tablet Commonly known as:  IMITREX Take 100 mg by mouth every 2 (two) hours as needed for migraine. May repeat in 2 hours if headache persists or recurs.   topiramate 100 MG tablet Commonly known as:  TOPAMAX Take 1 tablet (100 mg total) by mouth 2 (two) times daily.   TRELEGY ELLIPTA 100-62.5-25 MCG/INH Aepb Generic drug:  Fluticasone-Umeclidin-Vilant Inhale into the lungs.   venlafaxine XR 75 MG 24 hr capsule Commonly known as:  EFFEXOR-XR Take 2 capsules (150 mg total) by mouth daily with breakfast.   Vitamin D3 1000 units Caps Take by mouth.            Objective:    BP 112/77   Pulse 62   Temp 98.5 F (36.9 C) (Oral)   Ht 5\' 8"  (1.727 m)   Wt 183 lb 9.6 oz (83.3 kg)   BMI 27.92 kg/m   Allergies  Allergen Reactions  . Penicillins     Wt Readings from Last 3 Encounters:  09/29/17 183 lb 9.6 oz (83.3 kg)  06/20/17 186 lb 9.6 oz (84.6 kg)  02/13/17 180 lb (81.6 kg)    Physical Exam  Constitutional: She is oriented to person, place, and time. She appears well-developed and well-nourished.  HENT:  Head: Normocephalic and atraumatic.  Eyes: Pupils are equal, round, and reactive to light. Conjunctivae and EOM are normal.  Cardiovascular: Normal rate, regular rhythm, normal heart sounds and intact distal pulses.  Pulmonary/Chest: Effort normal and breath sounds normal.  Abdominal: Soft. Bowel sounds are normal.  Neurological: She is alert and oriented to person, place, and time. She has normal reflexes.  Skin: Skin is warm and dry. No rash noted.  Psychiatric: She has a normal mood and affect. Her behavior is normal. Judgment and thought content normal.        Assessment & Plan:   1. Arthralgia of lower leg, unspecified laterality - HYDROcodone-acetaminophen (NORCO) 10-325 MG tablet; Take 1-2 tablets by mouth every 8 (eight) hours as needed.  Dispense: 180 tablet; Refill: 0 - HYDROcodone-acetaminophen (NORCO) 10-325 MG tablet; Take 1-2 tablets by mouth every 8 (eight) hours as needed.  Dispense: 180 tablet; Refill: 0 - HYDROcodone-acetaminophen (NORCO) 10-325 MG tablet; Take 1-2 tablets by mouth every 8 (eight) hours as needed for moderate pain.  Dispense: 180 tablet; Refill: 0  2. Benign joint hypermobility syndrome - HYDROcodone-acetaminophen (NORCO) 10-325 MG tablet; Take 1-2 tablets by mouth every 8 (eight) hours as needed.  Dispense: 180 tablet; Refill: 0 - HYDROcodone-acetaminophen (NORCO) 10-325 MG tablet; Take 1-2 tablets by mouth every 8 (eight) hours as needed.  Dispense: 180 tablet; Refill: 0 -  HYDROcodone-acetaminophen (NORCO) 10-325 MG tablet; Take 1-2 tablets by mouth every 8 (eight) hours as needed for moderate pain.  Dispense: 180 tablet; Refill: 0  3. Other migraine with status migrainosus, not intractable - topiramate (TOPAMAX) 100 MG tablet; Take 1 tablet (100 mg total) by mouth 2 (two) times daily.  Dispense: 180 tablet; Refill: 3   Continue all other maintenance medications as listed above.  Follow up plan: Return in about 3 months (around 12/30/2017) for recheck.  Educational handout given for survey  Remus LofflerAngel S. Taelar Gronewold PA-C [facility] [address]  [place], [place] [id number] [phone]   09/29/2017, 11:06 AM

## 2017-10-13 ENCOUNTER — Ambulatory Visit: Payer: BLUE CROSS/BLUE SHIELD | Admitting: Physician Assistant

## 2017-10-21 ENCOUNTER — Ambulatory Visit: Payer: BLUE CROSS/BLUE SHIELD | Admitting: Physician Assistant

## 2017-10-22 ENCOUNTER — Encounter: Payer: Self-pay | Admitting: Physician Assistant

## 2017-11-28 ENCOUNTER — Other Ambulatory Visit: Payer: Self-pay | Admitting: Physician Assistant

## 2017-11-28 DIAGNOSIS — G2581 Restless legs syndrome: Secondary | ICD-10-CM

## 2017-12-01 ENCOUNTER — Ambulatory Visit (INDEPENDENT_AMBULATORY_CARE_PROVIDER_SITE_OTHER): Payer: BLUE CROSS/BLUE SHIELD

## 2017-12-01 DIAGNOSIS — Z23 Encounter for immunization: Secondary | ICD-10-CM

## 2017-12-25 ENCOUNTER — Other Ambulatory Visit: Payer: Self-pay | Admitting: Physician Assistant

## 2017-12-25 DIAGNOSIS — M25569 Pain in unspecified knee: Secondary | ICD-10-CM

## 2017-12-25 DIAGNOSIS — M357 Hypermobility syndrome: Secondary | ICD-10-CM

## 2018-02-11 ENCOUNTER — Ambulatory Visit: Payer: BLUE CROSS/BLUE SHIELD | Admitting: Physician Assistant

## 2018-02-11 ENCOUNTER — Encounter: Payer: Self-pay | Admitting: Physician Assistant

## 2018-02-11 VITALS — BP 115/86 | HR 76 | Temp 97.1°F | Ht 68.0 in | Wt 174.4 lb

## 2018-02-11 DIAGNOSIS — M357 Hypermobility syndrome: Secondary | ICD-10-CM

## 2018-02-11 DIAGNOSIS — R202 Paresthesia of skin: Secondary | ICD-10-CM

## 2018-02-11 DIAGNOSIS — M25569 Pain in unspecified knee: Secondary | ICD-10-CM | POA: Diagnosis not present

## 2018-02-11 DIAGNOSIS — R200 Anesthesia of skin: Secondary | ICD-10-CM

## 2018-02-11 DIAGNOSIS — R0989 Other specified symptoms and signs involving the circulatory and respiratory systems: Secondary | ICD-10-CM | POA: Diagnosis not present

## 2018-02-11 MED ORDER — HYDROCODONE-ACETAMINOPHEN 10-325 MG PO TABS: 1.0000 | ORAL_TABLET | Freq: Three times a day (TID) | ORAL | 0 refills | Status: DC | PRN

## 2018-02-11 MED ORDER — ALPRAZOLAM 0.5 MG PO TABS: 0.5000 mg | ORAL_TABLET | Freq: Two times a day (BID) | ORAL | 5 refills | Status: DC

## 2018-02-15 NOTE — Progress Notes (Signed)
BP 115/86   Pulse 76   Temp (!) 97.1 F (36.2 C) (Oral)   Ht 5\' 8"  (1.727 m)   Wt 174 lb 6.4 oz (79.1 kg)   BMI 26.52 kg/m    Subjective:    Patient ID: Cynthia Hayden, female    DOB: 1981-02-17, 37 y.o.   MRN: [id number]  HPI: Cynthia Hayden is a 37 y.o. female presenting on 02/11/2018 for Depression (3 month ) and Pain  This patient comes in for periodic recheck on medications and conditions including arthralgias, joint hypermobility, numbness and tingling, decreased circulation at times.  She does have quite a decrease in the color of her right third toe.  Is quite dusky.  From the midfoot down her entire right foot is slightly below her.  And she states she has significant tingling numbness foot..   All medications are reviewed today. There are no reports of any problems with the medications. All of the medical conditions are reviewed and updated.  Lab work is reviewed and will be ordered as medically necessary. There are no new problems reported with today's visit.   Past Medical History:  Diagnosis Date  . Anxiety   . Arthritis   . Depression   . Hypermobility syndrome   . Migraine   . RLS (restless legs syndrome)    Relevant past medical, surgical, family and social history reviewed and updated as indicated. Interim medical history since our last visit reviewed. Allergies and medications reviewed and updated. DATA REVIEWED: CHART IN EPIC  Family History reviewed for pertinent findings.  Review of Systems  Constitutional: Negative.   HENT: Negative.   Eyes: Negative.   Respiratory: Negative.   Gastrointestinal: Negative.   Genitourinary: Negative.     Allergies as of 02/11/2018      Reactions   Penicillins       Medication List       Accurate as of February 11, 2018 11:59 PM. Always use your most recent med list.        albuterol 108 (90 Base) MCG/ACT inhaler Commonly known as:  PROVENTIL HFA;VENTOLIN HFA Inhale 2 puffs into the lungs every 6 (six)  hours as needed for wheezing or shortness of breath.   ALPRAZolam 0.5 MG tablet Commonly known as:  XANAX Take 1 tablet (0.5 mg total) by mouth 2 (two) times daily.   HYDROcodone-acetaminophen 10-325 MG tablet Commonly known as:  NORCO Take 1-2 tablets by mouth every 8 (eight) hours as needed.   HYDROcodone-acetaminophen 10-325 MG tablet Commonly known as:  NORCO Take 1-2 tablets by mouth every 8 (eight) hours as needed for moderate pain.   HYDROcodone-acetaminophen 10-325 MG tablet Commonly known as:  NORCO Take 1-2 tablets by mouth every 8 (eight) hours as needed.   meloxicam 7.5 MG tablet Commonly known as:  MOBIC Take 1 tablet (7.5 mg total) by mouth 2 (two) times daily.   rOPINIRole 2 MG tablet Commonly known as:  REQUIP TAKE 1 TO 2 TABLETS AT BEDTIME   SUMAtriptan 100 MG tablet Commonly known as:  IMITREX Take 100 mg by mouth every 2 (two) hours as needed for migraine. May repeat in 2 hours if headache persists or recurs.   topiramate 100 MG tablet Commonly known as:  TOPAMAX Take 1 tablet (100 mg total) by mouth 2 (two) times daily.   TRELEGY ELLIPTA 100-62.5-25 MCG/INH Aepb Generic drug:  Fluticasone-Umeclidin-Vilant Inhale into the lungs.   venlafaxine XR 75 MG 24 hr capsule Commonly known as:  EFFEXOR-XR Take 2 capsules (150 mg total) by mouth daily with breakfast.   Vitamin D3 25 MCG (1000 UT) Caps Take by mouth.          Objective:    BP 115/86   Pulse 76   Temp (!) 97.1 F (36.2 C) (Oral)   Ht 5\' 8"  (1.727 m)   Wt 174 lb 6.4 oz (79.1 kg)   BMI 26.52 kg/m   Allergies  Allergen Reactions  . Penicillins     Wt Readings from Last 3 Encounters:  02/11/18 174 lb 6.4 oz (79.1 kg)  09/29/17 183 lb 9.6 oz (83.3 kg)  06/20/17 186 lb 9.6 oz (84.6 kg)    Physical Exam Constitutional:      Appearance: She is well-developed.  HENT:     Head: Normocephalic and atraumatic.  Eyes:     Conjunctiva/sclera: Conjunctivae normal.     Pupils: Pupils  are equal, round, and reactive to light.  Cardiovascular:     Rate and Rhythm: Normal rate and regular rhythm.     Pulses:          Dorsalis pedis pulses are 1+ on the right side and 1+ on the left side.       Posterior tibial pulses are 1+ on the right side and 1+ on the left side.     Heart sounds: Normal heart sounds.  Pulmonary:     Effort: Pulmonary effort is normal.     Breath sounds: Normal breath sounds.  Abdominal:     General: Bowel sounds are normal.     Palpations: Abdomen is soft.  Musculoskeletal:     Right foot: Deformity present. No foot drop.       Feet:  Skin:    General: Skin is warm and dry.     Findings: No rash.  Neurological:     Mental Status: She is alert and oriented to person, place, and time.     Deep Tendon Reflexes: Reflexes are normal and symmetric.  Psychiatric:        Behavior: Behavior normal.        Thought Content: Thought content normal.        Judgment: Judgment normal.         Assessment & Plan:   1. Arthralgia of lower leg, unspecified laterality - HYDROcodone-acetaminophen (NORCO) 10-325 MG tablet; Take 1-2 tablets by mouth every 8 (eight) hours as needed.  Dispense: 180 tablet; Refill: 0 - HYDROcodone-acetaminophen (NORCO) 10-325 MG tablet; Take 1-2 tablets by mouth every 8 (eight) hours as needed for moderate pain.  Dispense: 180 tablet; Refill: 0 - HYDROcodone-acetaminophen (NORCO) 10-325 MG tablet; Take 1-2 tablets by mouth every 8 (eight) hours as needed.  Dispense: 180 tablet; Refill: 0  2. Benign joint hypermobility syndrome - HYDROcodone-acetaminophen (NORCO) 10-325 MG tablet; Take 1-2 tablets by mouth every 8 (eight) hours as needed.  Dispense: 180 tablet; Refill: 0 - HYDROcodone-acetaminophen (NORCO) 10-325 MG tablet; Take 1-2 tablets by mouth every 8 (eight) hours as needed for moderate pain.  Dispense: 180 tablet; Refill: 0 - HYDROcodone-acetaminophen (NORCO) 10-325 MG tablet; Take 1-2 tablets by mouth every 8 (eight)  hours as needed.  Dispense: 180 tablet; Refill: 0  3. Numbness and tingling of foot Continue medication  4. Decreased circulation in fingers or toes Continue medication TRY TO STOP SMOKING  Continue all other maintenance medications as listed above.  Follow up plan: Return in about 3 months (around 05/13/2018) for recheck.  Educational handout given for survey  Remus Loffler PA-C [facility] [address]  [place], [place] [address] [phone]   02/15/2018, 9:53 PM

## 2018-02-26 ENCOUNTER — Other Ambulatory Visit: Payer: Self-pay | Admitting: Physician Assistant

## 2018-02-26 DIAGNOSIS — G2581 Restless legs syndrome: Secondary | ICD-10-CM

## 2018-03-03 ENCOUNTER — Ambulatory Visit: Payer: BLUE CROSS/BLUE SHIELD | Admitting: Physician Assistant

## 2018-03-17 DIAGNOSIS — G5792 Unspecified mononeuropathy of left lower limb: Secondary | ICD-10-CM | POA: Diagnosis not present

## 2018-03-17 DIAGNOSIS — M79676 Pain in unspecified toe(s): Secondary | ICD-10-CM | POA: Diagnosis not present

## 2018-03-30 ENCOUNTER — Other Ambulatory Visit: Payer: Self-pay

## 2018-03-30 DIAGNOSIS — I739 Peripheral vascular disease, unspecified: Secondary | ICD-10-CM

## 2018-04-01 ENCOUNTER — Other Ambulatory Visit: Payer: Self-pay

## 2018-04-03 ENCOUNTER — Encounter: Payer: Self-pay | Admitting: Vascular Surgery

## 2018-04-03 ENCOUNTER — Encounter (HOSPITAL_COMMUNITY): Payer: Self-pay

## 2018-04-03 ENCOUNTER — Ambulatory Visit (HOSPITAL_COMMUNITY)
Admission: RE | Admit: 2018-04-03 | Discharge: 2018-04-03 | Disposition: A | Discharge status: Home / Self Care | Payer: Self-pay | Source: Ambulatory Visit | Attending: Family | Admitting: Family

## 2018-04-03 ENCOUNTER — Ambulatory Visit (INDEPENDENT_AMBULATORY_CARE_PROVIDER_SITE_OTHER): Payer: Self-pay | Admitting: Vascular Surgery

## 2018-04-03 ENCOUNTER — Other Ambulatory Visit: Payer: Self-pay

## 2018-04-03 VITALS — BP 120/80 | HR 87 | Temp 98.6°F | Resp 20 | Ht 68.0 in | Wt 174.0 lb

## 2018-04-03 DIAGNOSIS — R200 Anesthesia of skin: Secondary | ICD-10-CM

## 2018-04-03 DIAGNOSIS — R202 Paresthesia of skin: Secondary | ICD-10-CM

## 2018-04-03 DIAGNOSIS — I739 Peripheral vascular disease, unspecified: Secondary | ICD-10-CM | POA: Insufficient documentation

## 2018-04-03 NOTE — Progress Notes (Signed)
Patient ID: Cynthia Hayden, female   DOB: Oct 03, 1980, 38 y.o.   MRN: [id number]  Reason for Consult: New Patient (Initial Visit) (pins and needles felling in bilat feet discoloration of toe)   Referred by Remus LofflerJones, [name] S, PA-C  Subjective:     HPI:  Cynthia Hayden is a 38 y.o. female presents for evaluation bilateral lower extremity numbness and tingling as well as purple discoloration of her toes.  She has been prescribed gabapentin but does not take this routinely.  She does smoke.  She has restless leg syndrome.  She has been told that she has early COPD.  She does not have hypertension, diabetes, hypercholesterolemia and no family history of vascular disease.  She has not previously had vascular surgery herself.  Past Medical History:  Diagnosis Date  . Anxiety   . Arthritis   . Depression   . Hypermobility syndrome   . Migraine   . RLS (restless legs syndrome)    Family History  Problem Relation Age of Onset  . Diabetes Mother    History reviewed. No pertinent surgical history.  Short Social History:  Social History   Tobacco Use  . Smoking status: Current Every Day Smoker    Packs/day: 1.00    Types: Cigarettes  . Smokeless tobacco: Never Used  Substance Use Topics  . Alcohol use: No    Allergies  Allergen Reactions  . Penicillins     Current Outpatient Medications  Medication Sig Dispense Refill  . albuterol (PROVENTIL HFA;VENTOLIN HFA) 108 (90 Base) MCG/ACT inhaler Inhale 2 puffs into the lungs every 6 (six) hours as needed for wheezing or shortness of breath. 1 Inhaler 0  . ALPRAZolam (XANAX) 0.5 MG tablet Take 1 tablet (0.5 mg total) by mouth 2 (two) times daily. 60 tablet 5  . diclofenac sodium (VOLTAREN) 1 % GEL Apply 1 application topically 4 (four) times daily.    Marland Kitchen. HYDROcodone-acetaminophen (NORCO) 10-325 MG tablet Take 1-2 tablets by mouth every 8 (eight) hours as needed. 180 tablet 0  . HYDROcodone-acetaminophen (NORCO) 10-325 MG tablet Take 1-2  tablets by mouth every 8 (eight) hours as needed for moderate pain. 180 tablet 0  . HYDROcodone-acetaminophen (NORCO) 10-325 MG tablet Take 1-2 tablets by mouth every 8 (eight) hours as needed. 180 tablet 0  . meloxicam (MOBIC) 7.5 MG tablet Take 1 tablet (7.5 mg total) by mouth 2 (two) times daily. 180 tablet 3  . rOPINIRole (REQUIP) 2 MG tablet TAKE 1 TO 2 TABLETS AT BEDTIME 180 tablet 0  . SUMAtriptan (IMITREX) 100 MG tablet Take 100 mg by mouth every 2 (two) hours as needed for migraine. May repeat in 2 hours if headache persists or recurs.    . topiramate (TOPAMAX) 100 MG tablet Take 1 tablet (100 mg total) by mouth 2 (two) times daily. 180 tablet 3  . venlafaxine XR (EFFEXOR-XR) 75 MG 24 hr capsule Take 2 capsules (150 mg total) by mouth daily with breakfast. 180 capsule 3  . Cholecalciferol (VITAMIN D3) 1000 units CAPS Take by mouth.    . Fluticasone-Umeclidin-Vilant (TRELEGY ELLIPTA) 100-62.5-25 MCG/INH AEPB Inhale into the lungs.    . gabapentin (NEURONTIN) 100 MG capsule      Current Facility-Administered Medications  Medication Dose Route Frequency Provider Last Rate Last Dose  . medroxyPROGESTERone (DEPO-PROVERA) injection 150 mg  150 mg Intramuscular Q90 days Remus LofflerJones, [name] S, PA-C   150 mg at 06/28/16 1633    Review of Systems  Constitutional:  Constitutional negative. HENT:  HENT negative.  Eyes: Eyes negative.  Cardiovascular: Cardiovascular negative.  GI: Gastrointestinal negative.  Musculoskeletal: Positive for leg pain.  Neurological: Positive for numbness.       Restless leg bilaterally Hematologic: Hematologic/lymphatic negative.  Psychiatric: Psychiatric negative.        Objective:  Objective   Vitals:   04/03/18 1437  BP: 120/80  Pulse: 87  Resp: 20  Temp: 98.6 F (37 C)  SpO2: 100%  Weight: 174 lb (78.9 kg)  Height: 5\' 8"  (1.727 m)   Body mass index is 26.46 kg/m.  Physical Exam HENT:     Head: Normocephalic.  Eyes:     Pupils: Pupils are equal,  round, and reactive to light.  Cardiovascular:     Rate and Rhythm: Normal rate.     Pulses:          Carotid pulses are 2+ on the right side and 2+ on the left side.      Radial pulses are 2+ on the right side and 2+ on the left side.       Popliteal pulses are 2+ on the right side and 2+ on the left side.       Dorsalis pedis pulses are 2+ on the right side and 2+ on the left side.     Heart sounds: Normal heart sounds.  Pulmonary:     Effort: Pulmonary effort is normal.     Breath sounds: Normal breath sounds.  Abdominal:     General: Abdomen is flat.     Palpations: Abdomen is soft.  Musculoskeletal: Normal range of motion.        General: No swelling.  Skin:    General: Skin is warm and dry.     Capillary Refill: Capillary refill takes less than 2 seconds.     Comments: Her legs and feet have purple discoloration do not appear mottled and have brisk blanching  Neurological:     General: No focal deficit present.     Mental Status: She is alert and oriented to person, place, and time.  Psychiatric:        Behavior: Behavior normal.     Data: I have independently interpreted her bilateral ABIs which are greater than 1 and triphasic bilaterally.     Assessment/Plan:     38 year old female with bilateral lower extremity numbness and tingling and restless legs with purple discoloration of her feet.  Her ABIs were 1 although her toe pressures could not be obtained due to low amplitudes.  She has brisk blanching of her toes with quick capillary refill less than 2 seconds.  Given this I do not think that she has any vascular disease that could be intervened upon.  I recommended smoking cessation given her young age and early onset of issues that she is having.  I have also recommend she take the Neurontin she has been prescribed as a possible remedy to her numbness and tingling.  She demonstrates good understanding and I can see her on an as-needed basis.     Maeola HarmanBrandon Christopher  Jovonne Wilton MD [facility]

## 2018-04-06 ENCOUNTER — Other Ambulatory Visit: Payer: Self-pay | Admitting: *Deleted

## 2018-04-06 ENCOUNTER — Telehealth: Payer: Self-pay | Admitting: Physician Assistant

## 2018-04-06 DIAGNOSIS — R0989 Other specified symptoms and signs involving the circulatory and respiratory systems: Secondary | ICD-10-CM

## 2018-04-06 DIAGNOSIS — R252 Cramp and spasm: Secondary | ICD-10-CM

## 2018-04-06 DIAGNOSIS — R202 Paresthesia of skin: Secondary | ICD-10-CM

## 2018-04-06 DIAGNOSIS — R200 Anesthesia of skin: Secondary | ICD-10-CM

## 2018-04-06 NOTE — Telephone Encounter (Signed)
Patient seen Dr. Ulice Brilliant about knot on foot and tingling in bilateral feet.  Patient states that Dr. Ulice Brilliant does not know what is going on and patient would like to know what is the next step

## 2018-04-06 NOTE — Telephone Encounter (Signed)
Neurology referral if that is okay?

## 2018-04-06 NOTE — Telephone Encounter (Signed)
Patient aware and referral placed.

## 2018-05-03 IMAGING — DX DG CHEST 2V
2 series · 2 of 2 positions shown · non-contrast
Comparison: None.

CLINICAL DATA: Cough and syncope

EXAM:
CHEST  2 VIEW

[chest pa]
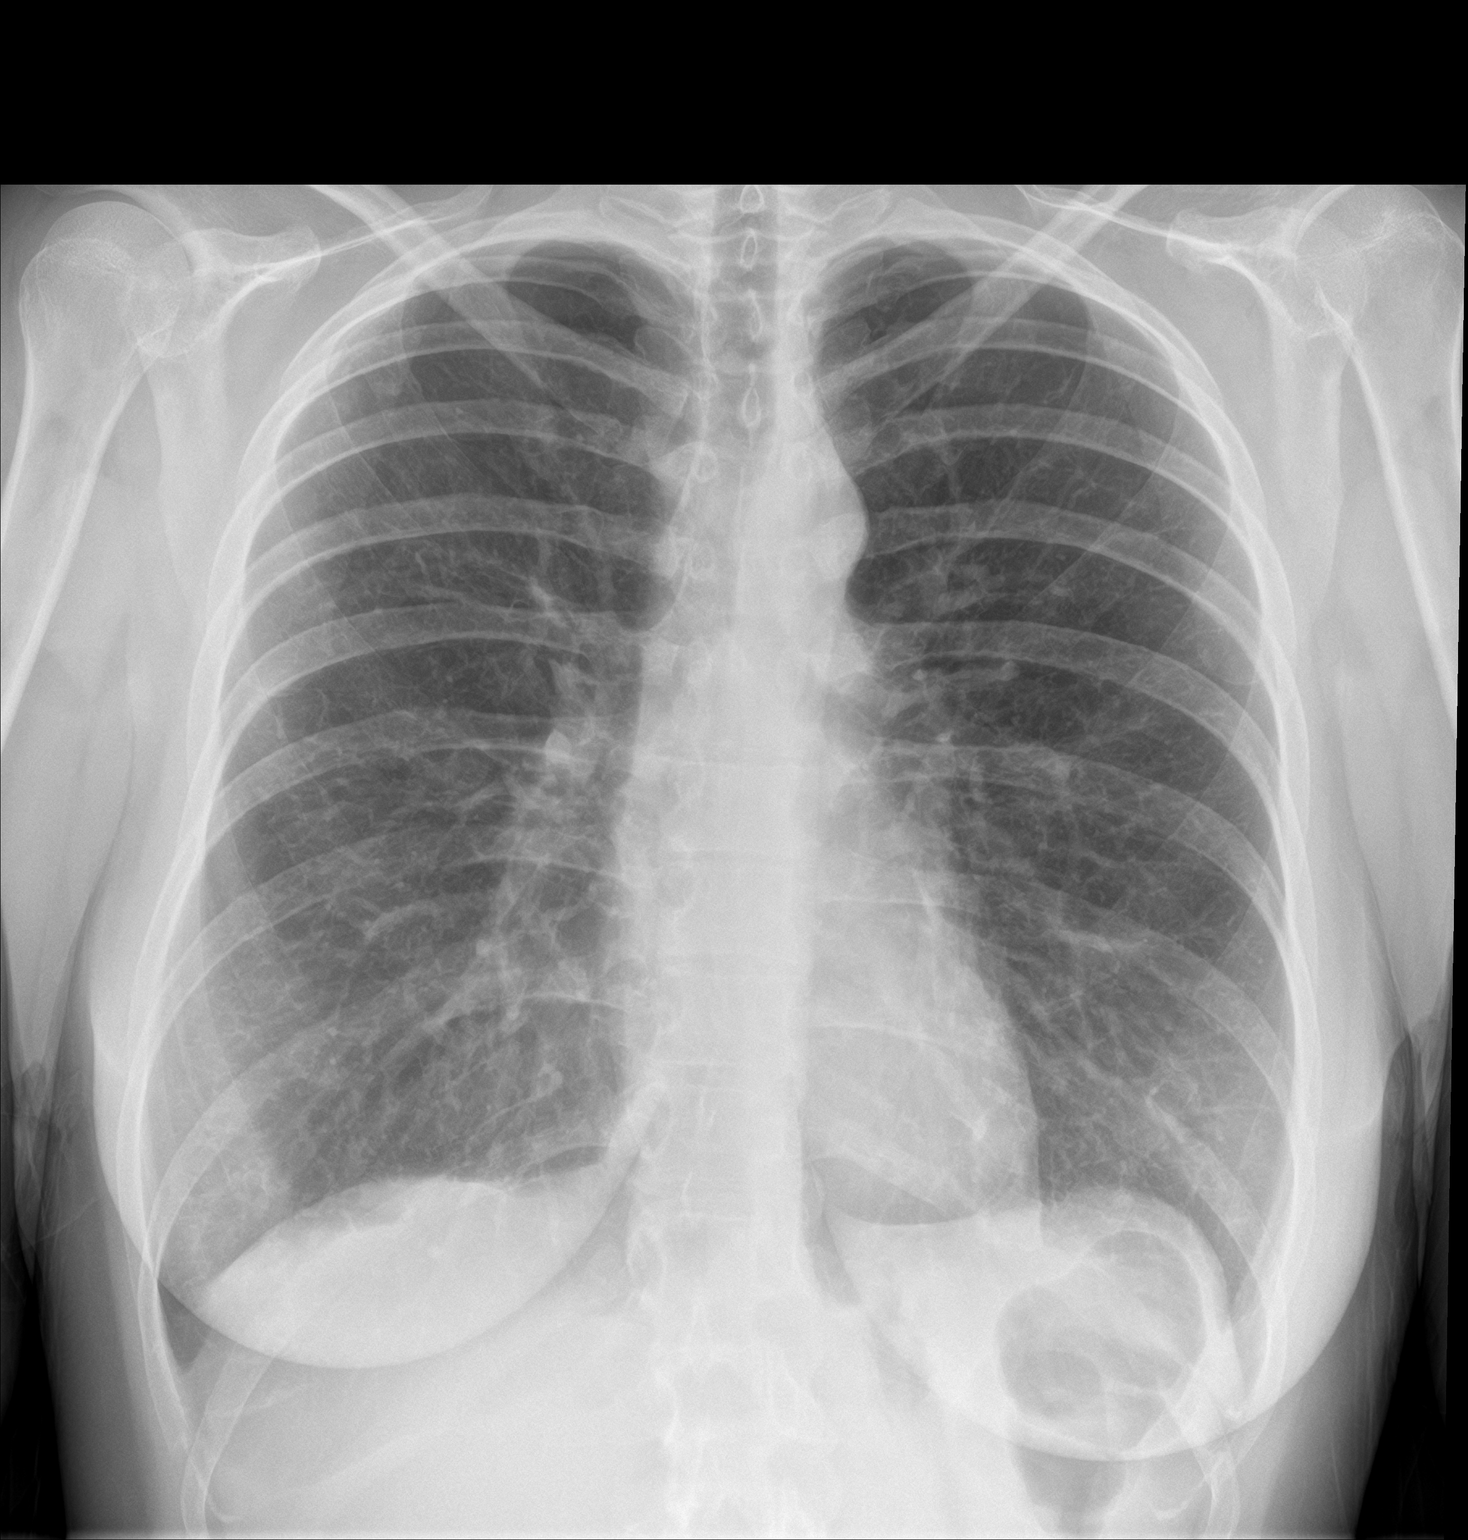

[chest lat]
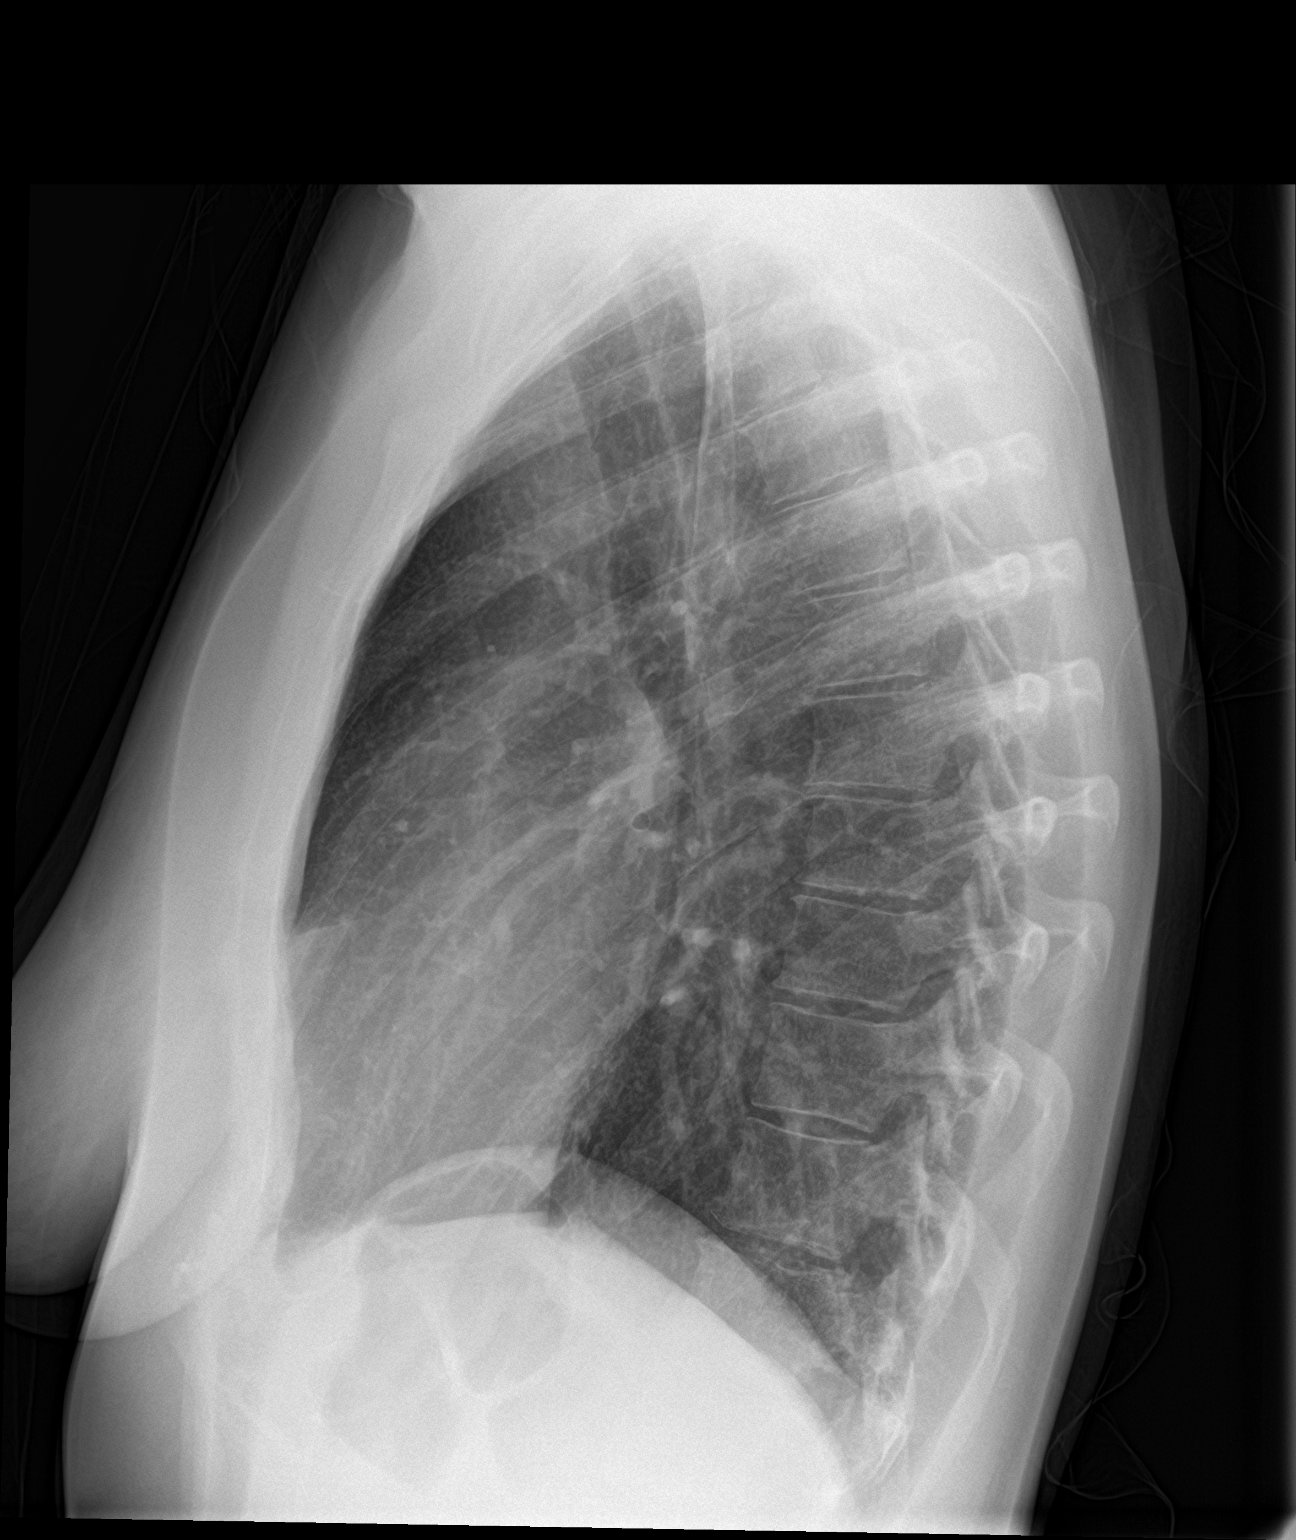

[2 of 2 positions shown; findings below may reference images not displayed]

FINDINGS: There is mild scarring in the bases. There is no edema or
consolidation. The heart size and pulmonary vascularity are normal.
No adenopathy. No bone lesions.
IMPRESSION: Mild bibasilar scarring.  No edema or consolidation.

## 2018-05-14 ENCOUNTER — Telehealth: Payer: Self-pay | Admitting: *Deleted

## 2018-05-14 NOTE — Telephone Encounter (Signed)
Patient aware she must be seen for controlled.

## 2018-05-19 ENCOUNTER — Other Ambulatory Visit: Payer: Self-pay

## 2018-05-19 ENCOUNTER — Encounter: Payer: Self-pay | Admitting: Physician Assistant

## 2018-05-19 ENCOUNTER — Ambulatory Visit: Payer: BLUE CROSS/BLUE SHIELD | Admitting: Physician Assistant

## 2018-05-19 VITALS — BP 137/87 | HR 108 | Temp 98.3°F | Ht 68.0 in | Wt 164.8 lb

## 2018-05-19 DIAGNOSIS — G2581 Restless legs syndrome: Secondary | ICD-10-CM | POA: Diagnosis not present

## 2018-05-19 DIAGNOSIS — R202 Paresthesia of skin: Secondary | ICD-10-CM

## 2018-05-19 DIAGNOSIS — R200 Anesthesia of skin: Secondary | ICD-10-CM

## 2018-05-19 DIAGNOSIS — M357 Hypermobility syndrome: Secondary | ICD-10-CM | POA: Diagnosis not present

## 2018-05-19 DIAGNOSIS — M25569 Pain in unspecified knee: Secondary | ICD-10-CM | POA: Diagnosis not present

## 2018-05-19 DIAGNOSIS — F411 Generalized anxiety disorder: Secondary | ICD-10-CM | POA: Insufficient documentation

## 2018-05-19 MED ORDER — HYDROCODONE-ACETAMINOPHEN 10-325 MG PO TABS: 1.0000 | ORAL_TABLET | Freq: Three times a day (TID) | ORAL | 0 refills | Status: DC | PRN

## 2018-05-19 NOTE — Progress Notes (Signed)
BP 137/87   Pulse (!) 108   Temp 98.3 F (36.8 C) (Oral)   Ht 5\' 8"  (1.727 m)   Wt 164 lb 12.8 oz (74.8 kg)   BMI 25.06 kg/m    Subjective:    Patient ID: Cynthia Hayden, female    DOB: Jan 26, 1981, 38 y.o.   MRN: [id number]  HPI: Cynthia Hayden is a 38 y.o. female presenting on 05/19/2018 for Pain (3 month follow up)  This patient comes in for periodic recheck on medications and conditions including recent chronic arthralgia and benign joint hypermobility syndrome.  Patient also has restless legs.  She also has generalized anxiety disorder I reviewed all of her medications and we will address some of the refills that she is needed.  She was seen by podiatry for the numbness in her foot and he does feel that it is not vascularly related but neurologically related.  Her restless legs caused her to rub her feet significantly and she has friction areas on her feet.  She was started with gabapentin 100 mg and has been taking it for the past month or so and tolerating it well.  I encouraged her to try to increase it to 300 mg over the next month.  When she is gotten to that level she can give [place] a call back and we will send in gabapentin 300 mg tablets to take at bedtime.Marland Kitchen    PAIN ASSESSMENT: Cause of pain-chronic arthralgias and benign joint hypermobility syndrome  This patient returns for a 3 month recheck on narcotic use for the above named conditions  Current medications-hydrocodone 10/325 has taken 1-2 every 8 hours.  She is taking less than 6/day.  We will go ahead and decrease it more to 5 or less per day.  She does feel that she is able to do this.  Plus with the increase of the gabapentin again she will have more pain relief. Medication side effects-none Any concerns-none  Pain on scale of 1-10-8 8 Frequency-Daily What increases pain-standing and walking What makes pain Better-resting Effects on ADL -mild. Any change in general medical condition-no  Effectiveness of current  meds-good Adverse reactions form pain meds-no PMP AWARE website reviewed: Yes Any suspicious activity on PMP Aware: No MME daily dose: 60 MME, will be reducing this month LME daily dose: 2 LME, working on reducing  Alprazolam this month by 1/2 tab perday  Contract on file Last UDS  05/19/2018  History of overdose or risk of abuse no .   Past Medical History:  Diagnosis Date  . Anxiety   . Arthritis   . Depression   . Hypermobility syndrome   . Migraine   . RLS (restless legs syndrome)    Relevant past medical, surgical, family and social history reviewed and updated as indicated. Interim medical history since our last visit reviewed. Allergies and medications reviewed and updated. DATA REVIEWED: CHART IN EPIC  Family History reviewed for pertinent findings.  Review of Systems  Constitutional: Negative.   HENT: Negative.   Eyes: Negative.   Respiratory: Negative.   Gastrointestinal: Negative.   Genitourinary: Negative.   Musculoskeletal: Positive for arthralgias, back pain, joint swelling and myalgias.  Neurological: Positive for weakness and numbness.  Psychiatric/Behavioral: The patient is nervous/anxious.     Allergies as of 05/19/2018      Reactions   Penicillins       Medication List       Accurate as of May 19, 2018  7:12 PM.  Always use your most recent med list.        albuterol 108 (90 Base) MCG/ACT inhaler Commonly known as:  PROVENTIL HFA;VENTOLIN HFA Inhale 2 puffs into the lungs every 6 (six) hours as needed for wheezing or shortness of breath.   ALPRAZolam 0.5 MG tablet Commonly known as:  XANAX Take 1 tablet (0.5 mg total) by mouth 2 (two) times daily.   diclofenac sodium 1 % Gel Commonly known as:  VOLTAREN Apply 1 application topically 4 (four) times daily.   gabapentin 100 MG capsule Commonly known as:  NEURONTIN   HYDROcodone-acetaminophen 10-325 MG tablet Commonly known as:  NORCO Take 1-2 tablets by mouth every 8 (eight) hours  as needed.   HYDROcodone-acetaminophen 10-325 MG tablet Commonly known as:  NORCO Take 1-2 tablets by mouth every 8 (eight) hours as needed for moderate pain.   HYDROcodone-acetaminophen 10-325 MG tablet Commonly known as:  NORCO Take 1-2 tablets by mouth every 8 (eight) hours as needed.   meloxicam 7.5 MG tablet Commonly known as:  MOBIC Take 1 tablet (7.5 mg total) by mouth 2 (two) times daily.   rOPINIRole 2 MG tablet Commonly known as:  REQUIP TAKE 1 TO 2 TABLETS AT BEDTIME   SUMAtriptan 100 MG tablet Commonly known as:  IMITREX Take 100 mg by mouth every 2 (two) hours as needed for migraine. May repeat in 2 hours if headache persists or recurs.   topiramate 100 MG tablet Commonly known as:  TOPAMAX Take 1 tablet (100 mg total) by mouth 2 (two) times daily.   Trelegy Ellipta 100-62.5-25 MCG/INH Aepb Generic drug:  Fluticasone-Umeclidin-Vilant Inhale into the lungs.   venlafaxine XR 75 MG 24 hr capsule Commonly known as:  EFFEXOR-XR Take 2 capsules (150 mg total) by mouth daily with breakfast.   Vitamin D3 25 MCG (1000 UT) Caps Take by mouth.          Objective:    BP 137/87   Pulse (!) 108   Temp 98.3 F (36.8 C) (Oral)   Ht 5\' 8"  (1.727 m)   Wt 164 lb 12.8 oz (74.8 kg)   BMI 25.06 kg/m   Allergies  Allergen Reactions  . Penicillins     Wt Readings from Last 3 Encounters:  05/19/18 164 lb 12.8 oz (74.8 kg)  04/03/18 174 lb (78.9 kg)  02/11/18 174 lb 6.4 oz (79.1 kg)    Physical Exam Constitutional:      Appearance: She is well-developed.  HENT:     Head: Normocephalic and atraumatic.  Eyes:     Conjunctiva/sclera: Conjunctivae normal.     Pupils: Pupils are equal, round, and reactive to light.  Cardiovascular:     Rate and Rhythm: Normal rate and regular rhythm.     Heart sounds: Normal heart sounds.  Pulmonary:     Effort: Pulmonary effort is normal.     Breath sounds: Normal breath sounds.  Abdominal:     General: Bowel sounds are  normal.     Palpations: Abdomen is soft.  Skin:    General: Skin is warm and dry.     Findings: No rash.  Neurological:     Mental Status: She is alert and oriented to person, place, and time.     Deep Tendon Reflexes: Reflexes are normal and symmetric.  Psychiatric:        Behavior: Behavior normal.        Thought Content: Thought content normal.        Judgment: Judgment  normal.         Assessment & Plan:   1. Arthralgia of lower leg, unspecified laterality - HYDROcodone-acetaminophen (NORCO) 10-325 MG tablet; Take 1-2 tablets by mouth every 8 (eight) hours as needed.  Dispense: 150 tablet; Refill: 0 - HYDROcodone-acetaminophen (NORCO) 10-325 MG tablet; Take 1-2 tablets by mouth every 8 (eight) hours as needed for moderate pain.  Dispense: 150 tablet; Refill: 0 - HYDROcodone-acetaminophen (NORCO) 10-325 MG tablet; Take 1-2 tablets by mouth every 8 (eight) hours as needed.  Dispense: 150 tablet; Refill: 0 - ToxASSURE Select 13 (MW), Urine  2. Benign joint hypermobility syndrome - HYDROcodone-acetaminophen (NORCO) 10-325 MG tablet; Take 1-2 tablets by mouth every 8 (eight) hours as needed.  Dispense: 150 tablet; Refill: 0 - HYDROcodone-acetaminophen (NORCO) 10-325 MG tablet; Take 1-2 tablets by mouth every 8 (eight) hours as needed for moderate pain.  Dispense: 150 tablet; Refill: 0 - HYDROcodone-acetaminophen (NORCO) 10-325 MG tablet; Take 1-2 tablets by mouth every 8 (eight) hours as needed.  Dispense: 150 tablet; Refill: 0 - ToxASSURE Select 13 (MW), Urine  3. Restless legs Continue requip 4 mg  4. Numbness and tingling of foot Gabapentin increased to 300 mg over the next Please call when you have run out of your current supply of gabapentin 100 mg  5. GAD (generalized anxiety disorder) Considering cymbalta at next visit - ToxASSURE Select 13 (MW), Urine   Continue all other maintenance medications as listed above.  Follow up plan: No follow-ups on file.   Educational handout given for survey  Remus Loffler PA-C [facility] [address]  [place], [place] [address] [phone]   05/19/2018, 7:12 PM

## 2018-05-20 NOTE — Progress Notes (Signed)
Agree with reduction of opioid.  Would consider doing visits on a month to month basis rather than prescribing 3 months at a time so that titration can be continued sooner.  Agree with increase in gabapentin. Taper alprazolam off.  Ashly M. Nadine Counts, DO [facility]

## 2018-05-22 LAB — TOXASSURE SELECT 13 (MW), URINE

## 2018-05-25 ENCOUNTER — Other Ambulatory Visit: Payer: Self-pay | Admitting: Physician Assistant

## 2018-05-25 DIAGNOSIS — G2581 Restless legs syndrome: Secondary | ICD-10-CM

## 2018-06-04 DIAGNOSIS — Z029 Encounter for administrative examinations, unspecified: Secondary | ICD-10-CM

## 2018-06-19 ENCOUNTER — Other Ambulatory Visit: Payer: Self-pay

## 2018-06-19 ENCOUNTER — Other Ambulatory Visit: Payer: Self-pay | Admitting: Physician Assistant

## 2018-06-19 ENCOUNTER — Ambulatory Visit (INDEPENDENT_AMBULATORY_CARE_PROVIDER_SITE_OTHER): Payer: BLUE CROSS/BLUE SHIELD | Admitting: Physician Assistant

## 2018-06-19 DIAGNOSIS — M25569 Pain in unspecified knee: Secondary | ICD-10-CM

## 2018-06-19 DIAGNOSIS — J411 Mucopurulent chronic bronchitis: Secondary | ICD-10-CM

## 2018-06-19 DIAGNOSIS — M357 Hypermobility syndrome: Secondary | ICD-10-CM | POA: Diagnosis not present

## 2018-06-19 DIAGNOSIS — J410 Simple chronic bronchitis: Secondary | ICD-10-CM | POA: Diagnosis not present

## 2018-06-19 MED ORDER — GABAPENTIN 300 MG PO CAPS: 300.0000 mg | ORAL_CAPSULE | Freq: Three times a day (TID) | ORAL | 2 refills | Status: DC

## 2018-06-22 ENCOUNTER — Encounter: Payer: Self-pay | Admitting: Physician Assistant

## 2018-06-22 NOTE — Progress Notes (Signed)
Telephone visit  Subjective: CC: Chronic bronchitis and neuropathy PCP: Remus LofflerJones, Tymesha Ditmore S, PA-C RUE:AVWUJHPI:Cynthia Hayden is a 38 y.o. female calls for telephone consult today. Patient provides verbal consent for consult held via phone.  Patient is identified with 2 separate identifiers.  At this time the entire area is on COVID-19 social distancing and stay home orders are in place.  Patient is of higher risk and therefore we are performing this by a virtual method.  Location of patient: Home Location of provider: [facility] Others present for call: No  Patient has a phone call related to her chronic pain and neuropathy.  She is building up gabapentin we will continue to titrated up.  She is tolerating it well.  She also has chronic bronchitis she has seen pulmonology before we will get the records from them to see what her testing involved and the outcome.  Right now she states she is fairly stable and staying at home and keeping herself safe.   ROS: Per HPI  Allergies  Allergen Reactions  . Penicillins    Past Medical History:  Diagnosis Date  . Anxiety   . Arthritis   . Depression   . Hypermobility syndrome   . Migraine   . RLS (restless legs syndrome)     Current Outpatient Medications:  .  albuterol (PROVENTIL HFA;VENTOLIN HFA) 108 (90 Base) MCG/ACT inhaler, Inhale 2 puffs into the lungs every 6 (six) hours as needed for wheezing or shortness of breath., Disp: 1 Inhaler, Rfl: 0 .  ALPRAZolam (XANAX) 0.5 MG tablet, Take 1 tablet (0.5 mg total) by mouth 2 (two) times daily., Disp: 60 tablet, Rfl: 5 .  Cholecalciferol (VITAMIN D3) 1000 units CAPS, Take by mouth., Disp: , Rfl:  .  diclofenac sodium (VOLTAREN) 1 % GEL, Apply 1 application topically 4 (four) times daily., Disp: , Rfl:  .  Fluticasone-Umeclidin-Vilant (TRELEGY ELLIPTA) 100-62.5-25 MCG/INH AEPB, Inhale into the lungs., Disp: , Rfl:  .  gabapentin (NEURONTIN) 300 MG capsule, Take 1 capsule (300 mg total) by mouth 3 (three)  times daily., Disp: 90 capsule, Rfl: 2 .  HYDROcodone-acetaminophen (NORCO) 10-325 MG tablet, Take 1-2 tablets by mouth every 8 (eight) hours as needed., Disp: 150 tablet, Rfl: 0 .  HYDROcodone-acetaminophen (NORCO) 10-325 MG tablet, Take 1-2 tablets by mouth every 8 (eight) hours as needed for moderate pain., Disp: 150 tablet, Rfl: 0 .  HYDROcodone-acetaminophen (NORCO) 10-325 MG tablet, Take 1-2 tablets by mouth every 8 (eight) hours as needed., Disp: 150 tablet, Rfl: 0 .  meloxicam (MOBIC) 7.5 MG tablet, Take 1 tablet (7.5 mg total) by mouth 2 (two) times daily., Disp: 180 tablet, Rfl: 3 .  rOPINIRole (REQUIP) 2 MG tablet, TAKE 1 TO 2 TABLETS AT BEDTIME, Disp: 180 tablet, Rfl: 0 .  SUMAtriptan (IMITREX) 100 MG tablet, Take 100 mg by mouth every 2 (two) hours as needed for migraine. May repeat in 2 hours if headache persists or recurs., Disp: , Rfl:  .  topiramate (TOPAMAX) 100 MG tablet, Take 1 tablet (100 mg total) by mouth 2 (two) times daily., Disp: 180 tablet, Rfl: 3 .  venlafaxine XR (EFFEXOR-XR) 75 MG 24 hr capsule, Take 2 capsules (150 mg total) by mouth daily with breakfast., Disp: 180 capsule, Rfl: 3  Current Facility-Administered Medications:  .  medroxyPROGESTERone (DEPO-PROVERA) injection 150 mg, 150 mg, Intramuscular, Q90 days, Prudy FeelerJones, Mamie Diiorio S, PA-C, 150 mg at 06/28/16 1633  Assessment/ Plan: 38 y.o. female   1. Simple chronic bronchitis (HCC) Continue  standard treatment  2. Mucopurulent chronic bronchitis (HCC) Continue standard treatment  3. Benign joint hypermobility syndrome - gabapentin (NEURONTIN) 300 MG capsule; Take 1 capsule (300 mg total) by mouth 3 (three) times daily.  Dispense: 90 capsule; Refill: 2  4. Arthralgia of lower leg, unspecified laterality - gabapentin (NEURONTIN) 300 MG capsule; Take 1 capsule (300 mg total) by mouth 3 (three) times daily.  Dispense: 90 capsule; Refill: 2  Start time: 3:00 PM End time: 3:12 PM  Meds ordered this encounter   Medications  . gabapentin (NEURONTIN) 300 MG capsule    Sig: Take 1 capsule (300 mg total) by mouth 3 (three) times daily.    Dispense:  90 capsule    Refill:  2    Order Specific Question:   Supervising Provider    Answer:   Raliegh Ip [[id number]]    Prudy Feeler PA-C [facility] [phone]

## 2018-07-03 ENCOUNTER — Encounter: Payer: Self-pay | Admitting: Physician Assistant

## 2018-08-03 ENCOUNTER — Other Ambulatory Visit: Payer: Self-pay | Admitting: Physician Assistant

## 2018-08-03 DIAGNOSIS — M25569 Pain in unspecified knee: Secondary | ICD-10-CM

## 2018-08-13 ENCOUNTER — Other Ambulatory Visit: Payer: Self-pay | Admitting: Physician Assistant

## 2018-08-13 DIAGNOSIS — M25569 Pain in unspecified knee: Secondary | ICD-10-CM

## 2018-08-13 DIAGNOSIS — M357 Hypermobility syndrome: Secondary | ICD-10-CM

## 2018-08-17 ENCOUNTER — Other Ambulatory Visit: Payer: Self-pay | Admitting: Physician Assistant

## 2018-08-17 DIAGNOSIS — G2581 Restless legs syndrome: Secondary | ICD-10-CM

## 2018-08-25 ENCOUNTER — Other Ambulatory Visit: Payer: Self-pay

## 2018-08-26 ENCOUNTER — Ambulatory Visit: Payer: BLUE CROSS/BLUE SHIELD | Admitting: Physician Assistant

## 2018-08-28 ENCOUNTER — Other Ambulatory Visit: Payer: Self-pay

## 2018-08-31 ENCOUNTER — Encounter: Payer: Self-pay | Admitting: Physician Assistant

## 2018-08-31 ENCOUNTER — Ambulatory Visit (INDEPENDENT_AMBULATORY_CARE_PROVIDER_SITE_OTHER): Payer: BC Managed Care – PPO | Admitting: Physician Assistant

## 2018-08-31 ENCOUNTER — Other Ambulatory Visit: Payer: Self-pay

## 2018-08-31 VITALS — BP 115/77 | HR 70 | Temp 97.8°F | Ht 68.0 in | Wt 152.0 lb

## 2018-08-31 DIAGNOSIS — F3341 Major depressive disorder, recurrent, in partial remission: Secondary | ICD-10-CM | POA: Diagnosis not present

## 2018-08-31 DIAGNOSIS — M25569 Pain in unspecified knee: Secondary | ICD-10-CM | POA: Diagnosis not present

## 2018-08-31 DIAGNOSIS — F411 Generalized anxiety disorder: Secondary | ICD-10-CM | POA: Diagnosis not present

## 2018-08-31 DIAGNOSIS — M357 Hypermobility syndrome: Secondary | ICD-10-CM

## 2018-08-31 MED ORDER — HYDROCODONE-ACETAMINOPHEN 10-325 MG PO TABS: 1.0000 | ORAL_TABLET | Freq: Four times a day (QID) | ORAL | 0 refills | Status: DC | PRN

## 2018-08-31 MED ORDER — ALPRAZOLAM 0.5 MG PO TABS: 0.5000 mg | ORAL_TABLET | Freq: Two times a day (BID) | ORAL | 2 refills | Status: DC

## 2018-08-31 MED ORDER — GABAPENTIN 400 MG PO CAPS: 800.0000 mg | ORAL_CAPSULE | Freq: Three times a day (TID) | ORAL | 5 refills | Status: DC

## 2018-08-31 MED ORDER — BUSPIRONE HCL 10 MG PO TABS: 5.0000 mg | ORAL_TABLET | Freq: Three times a day (TID) | ORAL | 5 refills | Status: DC
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
## 2018-08-31 NOTE — Patient Instructions (Signed)
[facility]  [phone]

## 2018-09-22 ENCOUNTER — Other Ambulatory Visit: Payer: Self-pay | Admitting: Physician Assistant

## 2018-09-22 DIAGNOSIS — G43801 Other migraine, not intractable, with status migrainosus: Secondary | ICD-10-CM

## 2018-11-30 ENCOUNTER — Telehealth: Payer: Self-pay | Admitting: Physician Assistant

## 2018-11-30 NOTE — Telephone Encounter (Signed)
FYI,  Mother says this will be ok with Nurse Abigail Butts.

## 2018-11-30 NOTE — Telephone Encounter (Signed)
Spoke with patient.

## 2018-12-01 ENCOUNTER — Ambulatory Visit: Payer: BC Managed Care – PPO | Admitting: Physician Assistant

## 2018-12-01 ENCOUNTER — Encounter: Payer: Self-pay | Admitting: Physician Assistant

## 2018-12-01 ENCOUNTER — Other Ambulatory Visit: Payer: Self-pay

## 2018-12-01 VITALS — BP 117/83 | HR 79 | Temp 98.0°F | Ht 68.0 in | Wt 149.0 lb

## 2018-12-01 DIAGNOSIS — F411 Generalized anxiety disorder: Secondary | ICD-10-CM

## 2018-12-01 DIAGNOSIS — M25569 Pain in unspecified knee: Secondary | ICD-10-CM

## 2018-12-01 DIAGNOSIS — F4310 Post-traumatic stress disorder, unspecified: Secondary | ICD-10-CM

## 2018-12-01 DIAGNOSIS — M357 Hypermobility syndrome: Secondary | ICD-10-CM

## 2018-12-01 DIAGNOSIS — F3341 Major depressive disorder, recurrent, in partial remission: Secondary | ICD-10-CM | POA: Diagnosis not present

## 2018-12-01 DIAGNOSIS — Z23 Encounter for immunization: Secondary | ICD-10-CM | POA: Diagnosis not present

## 2018-12-01 MED ORDER — ALPRAZOLAM 0.5 MG PO TABS: 0.5000 mg | ORAL_TABLET | Freq: Two times a day (BID) | ORAL | 2 refills | Status: DC

## 2018-12-01 MED ORDER — HYDROCODONE-ACETAMINOPHEN 10-325 MG PO TABS: 1.0000 | ORAL_TABLET | Freq: Four times a day (QID) | ORAL | 0 refills | Status: DC | PRN

## 2018-12-01 MED ORDER — ARIPIPRAZOLE 5 MG PO TABS: 5.0000 mg | ORAL_TABLET | Freq: Every day | ORAL | 1 refills | Status: DC
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
## 2018-12-28 ENCOUNTER — Other Ambulatory Visit: Payer: Self-pay | Admitting: Physician Assistant

## 2018-12-28 DIAGNOSIS — F3341 Major depressive disorder, recurrent, in partial remission: Secondary | ICD-10-CM

## 2018-12-31 ENCOUNTER — Other Ambulatory Visit: Payer: Self-pay

## 2018-12-31 DIAGNOSIS — Z20822 Contact with and (suspected) exposure to covid-19: Secondary | ICD-10-CM

## 2019-01-01 ENCOUNTER — Ambulatory Visit (INDEPENDENT_AMBULATORY_CARE_PROVIDER_SITE_OTHER): Payer: BC Managed Care – PPO | Admitting: Physician Assistant

## 2019-01-01 DIAGNOSIS — F3341 Major depressive disorder, recurrent, in partial remission: Secondary | ICD-10-CM

## 2019-01-01 LAB — NOVEL CORONAVIRUS, NAA: SARS-CoV-2, NAA: NOT DETECTED

## 2019-01-01 MED ORDER — ARIPIPRAZOLE 5 MG PO TABS: ORAL_TABLET | ORAL | 5 refills | Status: DC

## 2019-01-01 MED ORDER — VENLAFAXINE HCL ER 75 MG PO CP24: 150.0000 mg | ORAL_CAPSULE | Freq: Every day | ORAL | 3 refills | Status: DC

## 2019-01-01 NOTE — Progress Notes (Signed)
Telephone visit  Subjective: DV:[id number] PCP: Remus Loffler, PA-C GYI:RSWNI K Morss is a 38 y.o. female calls for telephone consult today. Patient provides verbal consent for consult held via phone.  Patient is identified with 2 separate identifiers.  At this time the entire area is on COVID-19 social distancing and stay home orders are in place.  Patient is of higher risk and therefore we are performing this by a virtual method.  Location of patient: home Location of provider: [facility] Others present for call: no  At this time the patient reports that she is feeling much better with using the venlafaxine with the Abilify.  She reports that she is doing better than she has in a long time her PHQ score is evident of a great improvement in her depression.  She states that overall she feels much calmer throughout the day.  Her sleep is good and restorative.  Depression screen [facility] 2/9 01/01/2019 12/01/2018 08/31/2018 05/19/2018 09/29/2017  Decreased Interest 0 2 0 0 0  Down, Depressed, Hopeless 1 2 0 0 0  PHQ - 2 Score 1 4 0 0 0  Altered sleeping - 1 - - -  Tired, decreased energy - 3 - - -  Change in appetite - 3 - - -  Feeling bad or failure about yourself  - 2 - - -  Trouble concentrating - 0 - - -  Moving slowly or fidgety/restless - 2 - - -  Suicidal thoughts - 0 - - -  PHQ-9 Score - 15 - - -     ROS: Per HPI  Allergies  Allergen Reactions  . Penicillins    Past Medical History:  Diagnosis Date  . Anxiety   . Arthritis   . Depression   . Hypermobility syndrome   . Migraine   . RLS (restless legs syndrome)     Current Outpatient Medications:  .  albuterol (PROVENTIL HFA;VENTOLIN HFA) 108 (90 Base) MCG/ACT inhaler, Inhale 2 puffs into the lungs every 6 (six) hours as needed for wheezing or shortness of breath., Disp: 1 Inhaler, Rfl: 0 .  ALPRAZolam (XANAX) 0.5 MG tablet, Take 1 tablet (0.5 mg total) by mouth 2 (two) times daily., Disp: 60 tablet, Rfl: 2 .   ARIPiprazole (ABILIFY) 5 MG tablet, TAKE ONE (1) TABLET EACH DAY, Disp: 30 tablet, Rfl: 5 .  busPIRone (BUSPAR) 10 MG tablet, Take 0.5-1 tablets (5-10 mg total) by mouth 3 (three) times daily. For anxiety, Disp: 90 tablet, Rfl: 5 .  diclofenac sodium (VOLTAREN) 1 % GEL, Apply 1 application topically 4 (four) times daily., Disp: , Rfl:  .  gabapentin (NEURONTIN) 400 MG capsule, Take 2 capsules (800 mg total) by mouth 3 (three) times daily., Disp: 120 capsule, Rfl: 5 .  HYDROcodone-acetaminophen (NORCO) 10-325 MG tablet, Take 1 tablet by mouth every 6 (six) hours as needed., Disp: 120 tablet, Rfl: 0 .  HYDROcodone-acetaminophen (NORCO) 10-325 MG tablet, Take 1 tablet by mouth every 6 (six) hours as needed for moderate pain., Disp: 120 tablet, Rfl: 0 .  HYDROcodone-acetaminophen (NORCO) 10-325 MG tablet, Take 1 tablet by mouth every 6 (six) hours as needed., Disp: 120 tablet, Rfl: 0 .  meloxicam (MOBIC) 7.5 MG tablet, TAKE ONE TABLET BY MOUTH TWICE DAILY, Disp: 180 tablet, Rfl: 3 .  rOPINIRole (REQUIP) 2 MG tablet, TAKE 1 TO 2 TABLETS AT BEDTIME, Disp: 180 tablet, Rfl: 3 .  SUMAtriptan (IMITREX) 100 MG tablet, Take 100 mg by mouth every 2 (two)  hours as needed for migraine. May repeat in 2 hours if headache persists or recurs., Disp: , Rfl:  .  topiramate (TOPAMAX) 100 MG tablet, TAKE ONE TABLET BY MOUTH TWICE DAILY, Disp: 180 tablet, Rfl: 3 .  venlafaxine XR (EFFEXOR-XR) 75 MG 24 hr capsule, Take 2 capsules (150 mg total) by mouth daily with breakfast., Disp: 180 capsule, Rfl: 3  Current Facility-Administered Medications:  .  medroxyPROGESTERone (DEPO-PROVERA) injection 150 mg, 150 mg, Intramuscular, Q90 days, Particia Nearing S, PA-C, 150 mg at 06/28/16 1633  Assessment/ Plan: 38 y.o. female   1. Recurrent major depressive disorder, in partial remission (HCC) - ARIPiprazole (ABILIFY) 5 MG tablet; TAKE ONE (1) TABLET EACH DAY  Dispense: 30 tablet; Refill: 5 - venlafaxine XR (EFFEXOR-XR) 75 MG 24 hr  capsule; Take 2 capsules (150 mg total) by mouth daily with breakfast.  Dispense: 180 capsule; Refill: 3   Keep regularly scheduled follow-ups  Continue all other maintenance medications as listed above.  Start time: 10:32 AM End time: 10:39 AM  Meds ordered this encounter  Medications  . ARIPiprazole (ABILIFY) 5 MG tablet    Sig: TAKE ONE (1) TABLET EACH DAY    Dispense:  30 tablet    Refill:  5    Order Specific Question:   Supervising Provider    Answer:   Janora Norlander [[id number]]  . venlafaxine XR (EFFEXOR-XR) 75 MG 24 hr capsule    Sig: Take 2 capsules (150 mg total) by mouth daily with breakfast.    Dispense:  180 capsule    Refill:  3    Order Specific Question:   Supervising Provider    Answer:   Janora Norlander [[id number]]    Particia Nearing PA-C [place] [phone]

## 2019-01-03 ENCOUNTER — Encounter: Payer: Self-pay | Admitting: Physician Assistant

## 2019-01-19 ENCOUNTER — Other Ambulatory Visit: Payer: Self-pay | Admitting: Physician Assistant

## 2019-01-19 DIAGNOSIS — M357 Hypermobility syndrome: Secondary | ICD-10-CM

## 2019-01-19 DIAGNOSIS — M25569 Pain in unspecified knee: Secondary | ICD-10-CM

## 2019-02-25 ENCOUNTER — Other Ambulatory Visit: Payer: Self-pay | Admitting: Physician Assistant

## 2019-02-25 DIAGNOSIS — M357 Hypermobility syndrome: Secondary | ICD-10-CM

## 2019-02-25 DIAGNOSIS — M25569 Pain in unspecified knee: Secondary | ICD-10-CM

## 2019-03-01 NOTE — Telephone Encounter (Signed)
Please refuse. Patient needs appointment

## 2019-03-03 ENCOUNTER — Other Ambulatory Visit: Payer: Self-pay | Admitting: Physician Assistant

## 2019-03-03 DIAGNOSIS — F411 Generalized anxiety disorder: Secondary | ICD-10-CM

## 2019-03-17 ENCOUNTER — Ambulatory Visit (INDEPENDENT_AMBULATORY_CARE_PROVIDER_SITE_OTHER): Payer: BC Managed Care – PPO | Admitting: Physician Assistant

## 2019-03-17 ENCOUNTER — Encounter: Payer: Self-pay | Admitting: Physician Assistant

## 2019-03-17 DIAGNOSIS — M25569 Pain in unspecified knee: Secondary | ICD-10-CM

## 2019-03-17 DIAGNOSIS — F411 Generalized anxiety disorder: Secondary | ICD-10-CM | POA: Diagnosis not present

## 2019-03-17 DIAGNOSIS — F3341 Major depressive disorder, recurrent, in partial remission: Secondary | ICD-10-CM

## 2019-03-17 DIAGNOSIS — M357 Hypermobility syndrome: Secondary | ICD-10-CM | POA: Diagnosis not present

## 2019-03-17 MED ORDER — HYDROCODONE-ACETAMINOPHEN 10-325 MG PO TABS: 1.0000 | ORAL_TABLET | Freq: Four times a day (QID) | ORAL | 0 refills | Status: DC | PRN

## 2019-03-17 MED ORDER — ARIPIPRAZOLE 10 MG PO TABS: 10.0000 mg | ORAL_TABLET | Freq: Once | ORAL | 11 refills | Status: DC

## 2019-03-17 MED ORDER — ALPRAZOLAM 0.5 MG PO TABS: 0.2500 mg | ORAL_TABLET | Freq: Two times a day (BID) | ORAL | 0 refills | Status: DC

## 2019-03-17 NOTE — Progress Notes (Signed)
Agree with wean from benzodiazepine.  Agree with close monthly follow up.  Lile Mccurley M. Nadine Counts, DO [facility]

## 2019-03-17 NOTE — Progress Notes (Signed)
1106    15     Telephone visit  Subjective: CC: Generalized anxiety, depression with mood, chronic pain secondary to hypermobility syndrome PCP: Terald Sleeper, PA-C QPY:[id number] Cynthia Hayden is a 39 y.o. female calls for telephone consult today. Patient provides verbal consent for consult held via phone.  Patient is identified with 2 separate identifiers.  At this time the entire area is on COVID-19 social distancing and stay home orders are in place.  Patient is of higher risk and therefore we are performing this by a virtual method.  Location of patient: Home Location of provider: HOME Others present for call: No  This patient is having a follow-up for her chronic medical conditions.  She does have chronic pain related to her hypermobility syndrome.  She does not feel like she can come off of the pain medicine at this time and she does not have a driver's license is hard for her to get to [facility] for appointments.  At this time we are going to wean her off of her alprazolam and I will just see her back on a monthly basis.  She had been taking alprazolam twice daily and for the next month and can have her take 1 tablet at bedtime and 1/2 tablet during the day.  She is to give [place] a call if there is any significant changes.  PAIN ASSESSMENT: Cause of pain-chronic arthralgias and joint hypermobility syndrome Current medications- hydrocodone 10/325 1 tab q6 hours Gabapentin 400 mg 1-2 tab TID, Voltaren gel Mobic 7.5 mg  Medication side effects-none Any concerns-none  Pain on scale of 1-10-8 8 Frequency-Daily What increases pain-standing and walking What makes pain Better-resting Effects on ADL -mild. Any change in general medical condition-no  Effectiveness of current meds-good Adverse reactions form pain meds-no PMP AWARE website reviewed:Yes Any suspicious activity on PMP Aware:No MME daily dose:40 MME, Cannot reduce anymore, no license to drive to pain management.  Plan to taper alprazolam LME daily dose:2 LME  Contract on file12/26/2019, coming 03/2019 in office. Told today to make appointment for in person. Last UDS3/24/2020  History of overdose or risk of abuseno  ANXIETY ASSESSMENT Cause of anxiety: GAD, PTSD This patient returns for a  month recheck on narcotic use for the above named condition(s)  Current medications- Alprazolam 0.5 mg 1 tablet up to twice daily REDUCE to 1/2 tab in day, 1 tab QHS. Effexor 150 mg 1 daily BuSpar 10 mg titrating up to 3 times a day, going to take more regularly Other medications tried: multiple SSRI, wellbutrin, cymbalta Medication side effects- none Any concerns- no Any change in general medical condition- no Effectiveness of current meds- good PMP AWARE website reviewed: Yes Any suspicious activity on PMP Aware: No LME daily dose: 2     ROS: Per HPI  Allergies  Allergen Reactions  . Penicillins    Past Medical History:  Diagnosis Date  . Anxiety   . Arthritis   . Depression   . Hypermobility syndrome   . Migraine   . RLS (restless legs syndrome)     Current Outpatient Medications:  .  albuterol (PROVENTIL HFA;VENTOLIN HFA) 108 (90 Base) MCG/ACT inhaler, Inhale 2 puffs into the lungs every 6 (six) hours as needed for wheezing or shortness of breath., Disp: 1 Inhaler, Rfl: 0 .  ALPRAZolam (XANAX) 0.5 MG tablet, Take 0.5-1 tablets (0.25-0.5 mg total) by mouth 2 (two) times daily., Disp: 45 tablet, Rfl: 0 .  ARIPiprazole (ABILIFY) 10 MG tablet, Take 1 tablet (  10 mg total) by mouth once for 1 dose. TAKE ONE (1) TABLET EACH DAY, Disp: 30 tablet, Rfl: 11 .  busPIRone (BUSPAR) 10 MG tablet, Take 0.5-1 tablets (5-10 mg total) by mouth 3 (three) times daily. For anxiety, Disp: 90 tablet, Rfl: 5 .  diclofenac sodium (VOLTAREN) 1 % GEL, Apply 1 application topically 4 (four) times daily., Disp: , Rfl:  .  gabapentin (NEURONTIN) 400 MG capsule, TAKE TWO CAPSULES THREE TIMES DAILY, Disp:  120 capsule, Rfl: 5 .  HYDROcodone-acetaminophen (NORCO) 10-325 MG tablet, Take 1 tablet by mouth every 6 (six) hours as needed for moderate pain., Disp: 120 tablet, Rfl: 0 .  meloxicam (MOBIC) 7.5 MG tablet, TAKE ONE TABLET BY MOUTH TWICE DAILY, Disp: 180 tablet, Rfl: 3 .  rOPINIRole (REQUIP) 2 MG tablet, TAKE 1 TO 2 TABLETS AT BEDTIME, Disp: 180 tablet, Rfl: 3 .  SUMAtriptan (IMITREX) 100 MG tablet, Take 100 mg by mouth every 2 (two) hours as needed for migraine. May repeat in 2 hours if headache persists or recurs., Disp: , Rfl:  .  topiramate (TOPAMAX) 100 MG tablet, TAKE ONE TABLET BY MOUTH TWICE DAILY, Disp: 180 tablet, Rfl: 3 .  venlafaxine XR (EFFEXOR-XR) 75 MG 24 hr capsule, Take 2 capsules (150 mg total) by mouth daily with breakfast., Disp: 180 capsule, Rfl: 3  Current Facility-Administered Medications:  .  medroxyPROGESTERone (DEPO-PROVERA) injection 150 mg, 150 mg, Intramuscular, Q90 days, Prudy Feeler S, PA-C, 150 mg at 06/28/16 1633  Assessment/ Plan: 39 y.o. female   1. GAD (generalized anxiety disorder) - ALPRAZolam (XANAX) 0.5 MG tablet; Take 0.5-1 tablets (0.25-0.5 mg total)  REDUCE to 1/2 tab in day, 1 tab QHS Reduce quantity, in person appointment in Feb. Dispense: 45 tablet; Refill: 0  2. Recurrent major depressive disorder, in partial remission (HCC) - ARIPiprazole (ABILIFY) 10 MG tablet; Take 1 tablet (10 mg total) by mouth once for 1 dose. TAKE ONE (1) TABLET EACH DAY  Dispense: 30 tablet; Refill: 11  3. Arthralgia of lower leg, unspecified laterality - HYDROcodone-acetaminophen (NORCO) 10-325 MG tablet; Take 1 tablet by mouth every 6 (six) hours as needed for moderate pain.  Dispense: 120 tablet; Refill: 0  4. Benign joint hypermobility syndrome - HYDROcodone-acetaminophen (NORCO) 10-325 MG tablet; Take 1 tablet by mouth every 6 (six) hours as needed for moderate pain.  Dispense: 120 tablet; Refill: 0   No follow-ups on file.  Continue all other maintenance  medications as listed above.  Start time: 10:56 AM End time: 11:12 AM  Meds ordered this encounter  Medications  . ALPRAZolam (XANAX) 0.5 MG tablet    Sig: Take 0.5-1 tablets (0.25-0.5 mg total) by mouth 2 (two) times daily.    Dispense:  45 tablet    Refill:  0    Order Specific Question:   Supervising Provider    Answer:   Raliegh Ip [[id number]]  . ARIPiprazole (ABILIFY) 10 MG tablet    Sig: Take 1 tablet (10 mg total) by mouth once for 1 dose. TAKE ONE (1) TABLET EACH DAY    Dispense:  30 tablet    Refill:  11    Order Specific Question:   Supervising Provider    Answer:   Raliegh Ip [[id number]]  . HYDROcodone-acetaminophen (NORCO) 10-325 MG tablet    Sig: Take 1 tablet by mouth every 6 (six) hours as needed for moderate pain.    Dispense:  120 tablet    Refill:  0    Order Specific  Question:   Supervising Provider    Answer:   Raliegh Ip [[id number]]    Prudy Feeler PA-C [facility] [phone]

## 2019-04-21 ENCOUNTER — Other Ambulatory Visit: Payer: Self-pay

## 2019-04-26 ENCOUNTER — Ambulatory Visit: Payer: BC Managed Care – PPO | Admitting: Physician Assistant

## 2019-04-26 ENCOUNTER — Encounter: Payer: Self-pay | Admitting: Physician Assistant

## 2019-04-26 ENCOUNTER — Other Ambulatory Visit: Payer: Self-pay

## 2019-04-26 VITALS — BP 111/72 | HR 63 | Temp 98.9°F | Ht 68.0 in | Wt 165.4 lb

## 2019-04-26 DIAGNOSIS — M357 Hypermobility syndrome: Secondary | ICD-10-CM | POA: Diagnosis not present

## 2019-04-26 DIAGNOSIS — N946 Dysmenorrhea, unspecified: Secondary | ICD-10-CM

## 2019-04-26 DIAGNOSIS — M25569 Pain in unspecified knee: Secondary | ICD-10-CM

## 2019-04-26 DIAGNOSIS — F411 Generalized anxiety disorder: Secondary | ICD-10-CM | POA: Diagnosis not present

## 2019-04-26 DIAGNOSIS — Z79891 Long term (current) use of opiate analgesic: Secondary | ICD-10-CM | POA: Diagnosis not present

## 2019-04-26 MED ORDER — HYDROXYZINE PAMOATE 25 MG PO CAPS: 25.0000 mg | ORAL_CAPSULE | Freq: Every day | ORAL | 0 refills | Status: DC

## 2019-04-26 MED ORDER — MEDROXYPROGESTERONE ACETATE 150 MG/ML IM SUSP: 150.0000 mg | Freq: Once | INTRAMUSCULAR | 3 refills | Status: DC

## 2019-04-26 MED ORDER — HYDROCODONE-ACETAMINOPHEN 10-325 MG PO TABS: 1.0000 | ORAL_TABLET | Freq: Four times a day (QID) | ORAL | 0 refills | Status: DC | PRN

## 2019-04-26 NOTE — Progress Notes (Signed)
BP 111/72   Pulse 63   Temp 98.9 F (37.2 C)   Ht 5\' 8"  (1.727 m)   Wt 165 lb 6.4 oz (75 kg)   LMP 04/12/2019 (Approximate)   SpO2 100%   BMI 25.15 kg/m    Subjective:    Patient ID: Cynthia Hayden, female    DOB: 11-17-1980, 39 y.o.   MRN: [id number]   HPI: [name] is a 39 y.o. female presenting on 04/26/2019 for Medication Refill (ANXIETY, PAIN)  The patient has managed to get down to 1 tablet at bedtime of her alprazolam.  She does states she is ready to continue to go off.  I will have her plan to do 1/2 tablet for 6 to 10 days.  And then off.  We will also add Vistaril 25 mg at bedtime to see if this will help with anxiety and sleepiness.  She has been taking BuSpar but she feels like it keeps her awake.  So encouraged her to use that for daytime to help control anxiety and then the Vistaril at bedtime.   PAIN ASSESSMENT: Cause of pain-chronic arthralgias and joint hypermobility syndrome Current medications- hydrocodone 11/3249 tab q6 hours Gabapentin 400 mg 1-2 tab TID, Voltaren gel Mobic 7.5 mg  Medication side effects-none Any concerns-none  Pain on scale of 1-10-8 8 Frequency-Daily What increases pain-standing and walking What makes pain Better-resting Effects on ADL -mild. Any change in general medical condition-no  Effectiveness of current meds-good Adverse reactions form pain meds-no PMP AWARE website reviewed:Yes Any suspicious activity on PMP Aware:No MME daily dose:40MME, Cannot reduce anymore, no license to drive to pain management. Tapering alprazolam LME daily dose:1.5 LME  Lowering to half tablet at bedtime for 6 to 10 days Add Vistaril 25 mg at bedtime   ANXIETY ASSESSMENT Cause of anxiety:GAD, PTSD This patient returns for a month recheck on narcotic use for the above named condition(s)  Current medications- Alprazolam 0.5 mg 1 tablet up to twice daily REDUCE to 1/2 tab in day, 1 tab QHS.  She was able to go  ahead and go down to only 1 at bedtime and is ready to reduce.  Effexor 150 mg 1 daily BuSpar 10 mg take 1 tablet once or twice during the day, keeps awake Vistaril 25 mg at bedtime new start Other medications tried:multiple SSRI, wellbutrin, cymbalta Medication side effects-none Any concerns-no Any change in general medical condition-no Effectiveness of current meds-good PMP AWARE website reviewed:Yes Any suspicious activity on PMP Aware:No LME daily dose:this month final taper off  Past Medical History:  Diagnosis Date  . Anxiety   . Arthritis   . Depression   . Hypermobility syndrome   . Migraine   . RLS (restless legs syndrome)    Relevant past medical, surgical, family and social history reviewed and updated as indicated. Interim medical history since our last visit reviewed. Allergies and medications reviewed and updated. DATA REVIEWED: CHART IN EPIC  Family History reviewed for pertinent findings.  Review of Systems  Constitutional: Negative.   HENT: Negative.   Eyes: Negative.   Respiratory: Negative.   Gastrointestinal: Negative.   Genitourinary: Negative.     Allergies as of 04/26/2019      Reactions   Penicillins       Medication List       Accurate as of April 26, 2019 12:59 PM. If you have any questions, ask your nurse or doctor.        STOP taking  these medications   ALPRAZolam 0.5 MG tablet Commonly known as: XANAX Stopped by: Remus Loffler, PA-C     TAKE these medications   albuterol 108 (90 Base) MCG/ACT inhaler Commonly known as: VENTOLIN HFA Inhale 2 puffs into the lungs every 6 (six) hours as needed for wheezing or shortness of breath.   ARIPiprazole 10 MG tablet Commonly known as: ABILIFY Take 1 tablet (10 mg total) by mouth once for 1 dose. TAKE ONE (1) TABLET EACH DAY   busPIRone 10 MG tablet Commonly known as: BUSPAR Take 0.5-1 tablets (5-10 mg total) by mouth 3 (three) times daily. For anxiety   diclofenac sodium 1 %  Gel Commonly known as: VOLTAREN Apply 1 application topically 4 (four) times daily.   gabapentin 400 MG capsule Commonly known as: NEURONTIN TAKE TWO CAPSULES THREE TIMES DAILY   HYDROcodone-acetaminophen 10-325 MG tablet Commonly known as: NORCO Take 1 tablet by mouth every 6 (six) hours as needed for moderate pain. What changed: Another medication with the same name was added. Make sure you understand how and when to take each. Changed by: Remus Loffler, PA-C   HYDROcodone-acetaminophen 10-325 MG tablet Commonly known as: NORCO Take 1 tablet by mouth every 6 (six) hours as needed for severe pain. What changed: You were already taking a medication with the same name, and this prescription was added. Make sure you understand how and when to take each. Changed by: Remus Loffler, PA-C   HYDROcodone-acetaminophen 10-325 MG tablet Commonly known as: NORCO Take 1 tablet by mouth every 6 (six) hours as needed for severe pain. What changed: You were already taking a medication with the same name, and this prescription was added. Make sure you understand how and when to take each. Changed by: Remus Loffler, PA-C   hydrOXYzine 25 MG capsule Commonly known as: Vistaril Take 1-2 capsules (25-50 mg total) by mouth at bedtime. Started by: Remus Loffler, PA-C   medroxyPROGESTERone 150 MG/ML injection Commonly known as: DEPO-PROVERA Inject 1 mL (150 mg total) into the muscle once for 1 dose. Started by: Remus Loffler, PA-C   meloxicam 7.5 MG tablet Commonly known as: MOBIC TAKE ONE TABLET BY MOUTH TWICE DAILY   rOPINIRole 2 MG tablet Commonly known as: REQUIP TAKE 1 TO 2 TABLETS AT BEDTIME   SUMAtriptan 100 MG tablet Commonly known as: IMITREX Take 100 mg by mouth every 2 (two) hours as needed for migraine. May repeat in 2 hours if headache persists or recurs.   topiramate 100 MG tablet Commonly known as: TOPAMAX TAKE ONE TABLET BY MOUTH TWICE DAILY   venlafaxine XR 75 MG 24 hr  capsule Commonly known as: EFFEXOR-XR Take 2 capsules (150 mg total) by mouth daily with breakfast.          Objective:    BP 111/72   Pulse 63   Temp 98.9 F (37.2 C)   Ht 5\' 8"  (1.727 m)   Wt 165 lb 6.4 oz (75 kg)   LMP 04/12/2019 (Approximate)   SpO2 100%   BMI 25.15 kg/m   Allergies  Allergen Reactions  . Penicillins     Wt Readings from Last 3 Encounters:  04/26/19 165 lb 6.4 oz (75 kg)  12/01/18 149 lb (67.6 kg)  08/31/18 152 lb (68.9 kg)    Physical Exam Constitutional:      General: She is not in acute distress.    Appearance: Normal appearance. She is well-developed.  HENT:  Head: Normocephalic and atraumatic.  Cardiovascular:     Rate and Rhythm: Normal rate.  Pulmonary:     Effort: Pulmonary effort is normal.  Skin:    General: Skin is warm and dry.     Findings: No rash.  Neurological:     Mental Status: She is alert and oriented to person, place, and time.     Deep Tendon Reflexes: Reflexes are normal and symmetric.     Results for orders placed or performed in visit on 12/31/18  Novel Coronavirus, NAA ([facility])   Specimen: Nasopharyngeal(NP) swabs in vial transport medium   NASOPHARYNGE  TESTING  Result Value Ref Range   SARS-CoV-2, NAA Not Detected Not Detected      Assessment & Plan:   1. Arthralgia of lower leg, unspecified laterality - ToxASSURE Select 13 (MW), Urine - HYDROcodone-acetaminophen (NORCO) 10-325 MG tablet; Take 1 tablet by mouth every 6 (six) hours as needed for moderate pain.  Dispense: 120 tablet; Refill: 0  2. Benign joint hypermobility syndrome - ToxASSURE Select 13 (MW), Urine - HYDROcodone-acetaminophen (NORCO) 10-325 MG tablet; Take 1 tablet by mouth every 6 (six) hours as needed for moderate pain.  Dispense: 120 tablet; Refill: 0  3. GAD (generalized anxiety disorder) - ToxASSURE Select 13 (MW), Urine Alprazolam 0.5 1/2 tab QHS  I will have her plan to do 1/2 tablet for 6 to 10 days.  And then off.  We  will also add Vistaril 25 mg at bedtime to see if this will help with anxiety and sleepiness.  4. Dysmenorrhea - POCT urine pregnancy; Future   Continue all other maintenance medications as listed above.  Follow up plan: 3 months Sooner if there are any problems.  Educational handout given for survey  Remus Loffler PA-C [facility] [address]  [place], [place] [address] [phone]   04/26/2019, 12:59 PM

## 2019-04-26 NOTE — Progress Notes (Signed)
Agree with ongoing taper off of benzodiazepine.  She may benefit from genetic testing if she finds mental health concerns are not well controlled on new regimen, particularly given intolerance to multiple medications in the past.  Additionally, has [name] ever seen a joint specialist/ orthopedist?  I see that she has a hypermobility disorder.  They may have something to offer her.    Cynthia Hayden M. Nadine Counts, DO [facility]

## 2019-04-28 LAB — TOXASSURE SELECT 13 (MW), URINE

## 2019-04-29 ENCOUNTER — Ambulatory Visit (INDEPENDENT_AMBULATORY_CARE_PROVIDER_SITE_OTHER): Payer: BC Managed Care – PPO

## 2019-04-29 ENCOUNTER — Other Ambulatory Visit: Payer: Self-pay

## 2019-04-29 DIAGNOSIS — Z3042 Encounter for surveillance of injectable contraceptive: Secondary | ICD-10-CM | POA: Diagnosis not present

## 2019-04-29 DIAGNOSIS — N946 Dysmenorrhea, unspecified: Secondary | ICD-10-CM

## 2019-04-29 DIAGNOSIS — Z30013 Encounter for initial prescription of injectable contraceptive: Secondary | ICD-10-CM

## 2019-04-29 LAB — PREGNANCY, URINE: Preg Test, Ur: NEGATIVE

## 2019-04-29 NOTE — Progress Notes (Signed)
Medroxyprogesterone injection to right upper outer quadrant.  Patient tolerated well. 

## 2019-07-16 ENCOUNTER — Ambulatory Visit: Payer: BC Managed Care – PPO

## 2019-07-30 ENCOUNTER — Other Ambulatory Visit: Payer: Self-pay | Admitting: *Deleted

## 2019-07-30 DIAGNOSIS — G43801 Other migraine, not intractable, with status migrainosus: Secondary | ICD-10-CM

## 2019-07-30 DIAGNOSIS — G2581 Restless legs syndrome: Secondary | ICD-10-CM

## 2019-07-30 MED ORDER — TOPIRAMATE 100 MG PO TABS: 100.0000 mg | ORAL_TABLET | Freq: Two times a day (BID) | ORAL | 0 refills | Status: DC

## 2019-07-30 MED ORDER — ROPINIROLE HCL 2 MG PO TABS: ORAL_TABLET | ORAL | 0 refills | Status: DC

## 2019-08-02 ENCOUNTER — Encounter: Payer: Self-pay | Admitting: Nurse Practitioner

## 2019-08-02 ENCOUNTER — Ambulatory Visit: Payer: BC Managed Care – PPO | Admitting: Physician Assistant

## 2019-08-02 ENCOUNTER — Ambulatory Visit: Payer: BC Managed Care – PPO | Admitting: Nurse Practitioner

## 2019-08-02 ENCOUNTER — Other Ambulatory Visit: Payer: Self-pay

## 2019-08-02 VITALS — BP 99/73 | HR 60 | Temp 97.8°F | Resp 20 | Ht 68.0 in | Wt 163.0 lb

## 2019-08-02 DIAGNOSIS — F3341 Major depressive disorder, recurrent, in partial remission: Secondary | ICD-10-CM | POA: Diagnosis not present

## 2019-08-02 DIAGNOSIS — F4310 Post-traumatic stress disorder, unspecified: Secondary | ICD-10-CM | POA: Diagnosis not present

## 2019-08-02 DIAGNOSIS — G43801 Other migraine, not intractable, with status migrainosus: Secondary | ICD-10-CM

## 2019-08-02 DIAGNOSIS — G2581 Restless legs syndrome: Secondary | ICD-10-CM

## 2019-08-02 DIAGNOSIS — M357 Hypermobility syndrome: Secondary | ICD-10-CM

## 2019-08-02 DIAGNOSIS — Z79891 Long term (current) use of opiate analgesic: Secondary | ICD-10-CM | POA: Diagnosis not present

## 2019-08-02 DIAGNOSIS — F411 Generalized anxiety disorder: Secondary | ICD-10-CM | POA: Diagnosis not present

## 2019-08-02 DIAGNOSIS — J410 Simple chronic bronchitis: Secondary | ICD-10-CM

## 2019-08-02 MED ORDER — VENLAFAXINE HCL ER 75 MG PO CP24: 150.0000 mg | ORAL_CAPSULE | Freq: Every day | ORAL | 3 refills | Status: DC

## 2019-08-02 MED ORDER — TOPIRAMATE 100 MG PO TABS: 100.0000 mg | ORAL_TABLET | Freq: Two times a day (BID) | ORAL | 0 refills | Status: DC

## 2019-08-02 MED ORDER — MELOXICAM 7.5 MG PO TABS: 7.5000 mg | ORAL_TABLET | Freq: Two times a day (BID) | ORAL | 3 refills | Status: DC

## 2019-08-02 MED ORDER — HYDROCODONE-ACETAMINOPHEN 10-325 MG PO TABS: 1.0000 | ORAL_TABLET | Freq: Three times a day (TID) | ORAL | 0 refills | Status: DC | PRN

## 2019-08-02 MED ORDER — GABAPENTIN 400 MG PO CAPS: ORAL_CAPSULE | ORAL | 5 refills | Status: DC

## 2019-08-02 MED ORDER — ROPINIROLE HCL 2 MG PO TABS: ORAL_TABLET | ORAL | 0 refills | Status: DC

## 2019-08-02 MED ORDER — HYDROCODONE-ACETAMINOPHEN 10-325 MG PO TABS: 1.0000 | ORAL_TABLET | Freq: Three times a day (TID) | ORAL | 0 refills | Status: AC | PRN

## 2019-08-02 NOTE — Progress Notes (Addendum)
Subjective:    Patient ID: Cynthia Hayden, female    DOB: 28-May-1980, 39 y.o.   MRN: [id number]   Chief Complaint: Medical Management of Chronic Issues    HPI:  1. GAD (generalized anxiety disorder) Patient was on buspar for anxiety. He stopped taking because she said that it did not really help. GAD 7 : Generalized Anxiety Score 08/02/2019 04/26/2019 12/01/2018  Nervous, Anxious, on Edge 0 1 2  Control/stop worrying 0 1 2  Worry too much - different things 0 1 2  Trouble relaxing 0 0 1  Restless 0 1 0  Easily annoyed or irritable 0 1 3  Afraid - awful might happen 0 1 1  Total GAD 7 Score 0 6 11  Anxiety Difficulty - Somewhat difficult Somewhat difficult      2. PTSD (post-traumatic stress disorder) stays anxious a stated above  3. Recurrent major depressive disorder, in partial remission [facility]) Patient is still on effexor daily. She was on abilify, but she stopped taking aid she seemed to be doing well and did not think she needs it any longer. Depression screen [facility] 2/9 08/02/2019 04/26/2019 01/01/2019  Decreased Interest 0 1 0  Down, Depressed, Hopeless 0 1 1  PHQ - 2 Score 0 2 1  Altered sleeping - 2 -  Tired, decreased energy - 2 -  Change in appetite - 2 -  Feeling bad or failure about yourself  - 0 -  Trouble concentrating - 1 -  Moving slowly or fidgety/restless - 0 -  Suicidal thoughts - 0 -  PHQ-9 Score - 9 -  Difficult doing work/chores - Somewhat difficult -     4. Restless legs He is on requip nightly. She ays she can tell a big difference when he doe snot take it.  5. Benign joint hypermobility syndrome Pain assessment: Cause of pain- mainly in knees Pain location- knees Pain on scale of 1-10- 2/10 Frequency- not everyday What increases pain-sitting  What makes pain Better-movemnet Effects on ADL - none Any change in general medical condition-none  Current opioids rx- norco 10/325 qid # meds rx- 120 Effectiveness of current meds-helps Adverse  reactions form pain meds-none Morphine equivalent- 40MEDD  Pill count performed-No Last drug screen - 04/26/19 ( high risk q48m moderate risk q659mlow risk yearly ) Urine drug screen today- yes Was the [place]- yes  If yes were their any concerning findings? - no   Overdose risk: 1  Pain contract signed on:04/28/19   6. Simple chronic bronchitis (HCC) No problem. She does smoke 1 1/2 packs a day.  7. Other migraine with status migrainosus, not intractable Is on topamax daily for prevention. Rarely has migraine.     Outpatient Encounter Medications as of 08/02/2019  Medication Sig  . albuterol (PROVENTIL HFA;VENTOLIN HFA) 108 (90 Base) MCG/ACT inhaler Inhale 2 puffs into the lungs every 6 (six) hours as needed for wheezing or shortness of breath.  . gabapentin (NEURONTIN) 400 MG capsule TAKE TWO CAPSULES THREE TIMES DAILY  . HYDROcodone-acetaminophen (NORCO) 10-325 MG tablet Take 1 tablet by mouth every 6 (six) hours as needed for moderate pain.  . Marland KitchenYDROcodone-acetaminophen (NORCO) 10-325 MG tablet Take 1 tablet by mouth every 6 (six) hours as needed for severe pain.  . Marland KitchenYDROcodone-acetaminophen (NORCO) 10-325 MG tablet Take 1 tablet by mouth every 6 (six) hours as needed for severe pain.  . meloxicam (MOBIC) 7.5 MG tablet TAKE ONE TABLET BY MOUTH TWICE DAILY  . rOPINIRole (REQUIP)  2 MG tablet TAKE 1 TO 2 TABLETS AT BEDTIME  . SUMAtriptan (IMITREX) 100 MG tablet Take 100 mg by mouth every 2 (two) hours as needed for migraine. May repeat in 2 hours if headache persists or recurs.  . topiramate (TOPAMAX) 100 MG tablet Take 1 tablet (100 mg total) by mouth 2 (two) times daily.  Marland Kitchen venlafaxine XR (EFFEXOR-XR) 75 MG 24 hr capsule Take 2 capsules (150 mg total) by mouth daily with breakfast.              . medroxyPROGESTERone (DEPO-PROVERA) 150 MG/ML injection Inject 1 mL (150 mg total) into the muscle once for 1 dose.          History reviewed. No pertinent surgical  history.  Family History  Problem Relation Age of Onset  . Diabetes Mother     New complaints: None today  Social history: Lives her husband and 2 children. Doe not work   Review of Systems  Constitutional: Negative for diaphoresis.  Eyes: Negative for pain.  Respiratory: Negative for shortness of breath.   Cardiovascular: Negative for chest pain, palpitations and leg swelling.  Gastrointestinal: Negative for abdominal pain.  Endocrine: Negative for polydipsia.  Musculoskeletal: Positive for arthralgias (all over).  Skin: Negative for rash.  Neurological: Negative for dizziness, weakness and headaches.  Hematological: Does not bruise/bleed easily.  All other systems reviewed and are negative.      Objective:   Physical Exam Vitals and nursing note reviewed.  Constitutional:      General: She is not in acute distress.    Appearance: Normal appearance. She is well-developed.  HENT:     Head: Normocephalic.     Nose: Nose normal.     Mouth/Throat:     Comments: Multiple dental caries Eyes:     Pupils: Pupils are equal, round, and reactive to light.  Neck:     Vascular: No carotid bruit or JVD.  Cardiovascular:     Rate and Rhythm: Normal rate and regular rhythm.     Heart sounds: Normal heart sounds.  Pulmonary:     Effort: Pulmonary effort is normal. No respiratory distress.     Breath sounds: Normal breath sounds. No wheezing or rales.  Chest:     Chest wall: No tenderness.  Abdominal:     General: Bowel sounds are normal. There is no distension or abdominal bruit.     Palpations: Abdomen is soft. There is no hepatomegaly, splenomegaly, mass or pulsatile mass.     Tenderness: There is no abdominal tenderness.  Musculoskeletal:        General: Normal range of motion.     Cervical back: Normal range of motion and neck supple.     Comments: Double jointed in all joints  Lymphadenopathy:     Cervical: No cervical adenopathy.  Skin:    General: Skin is warm  and dry.  Neurological:     Mental Status: She is alert and oriented to person, place, and time.     Deep Tendon Reflexes: Reflexes are normal and symmetric.  Psychiatric:        Behavior: Behavior normal.        Thought Content: Thought content normal.        Judgment: Judgment normal.    BP 99/73   Pulse 60   Temp 97.8 F (36.6 C) (Temporal)   Resp 20   Ht '5\' 8"'  (1.727 m)   Wt 163 lb (73.9 kg)   SpO2 99%  BMI 24.78 kg/m         Assessment & Plan:  Cynthia Hayden comes in today with chief complaint of Medical Management of Chronic Issues   Diagnosis and orders addressed:  1. GAD (generalized anxiety disorder) *continue stres management  2. PTSD (post-traumatic stress disorder)  3. Recurrent major depressive disorder, in partial remission (HCC) - venlafaxine XR (EFFEXOR-XR) 75 MG 24 hr capsule; Take 2 capsules (150 mg total) by mouth daily with breakfast.  Dispense: 180 capsule; Refill: 3  4. Restless legs kep leg warm at night - rOPINIRole (REQUIP) 2 MG tablet; TAKE 1 TO 2 TABLETS AT BEDTIME  Dispense: 60 tablet; Refill: 0  5. Benign joint hypermobility syndrome Decreased pain medication to TId from QID because she states some days she does not have much pain. - HYDROcodone-acetaminophen (NORCO) 10-325 MG tablet; Take 1 tablet by mouth 3 (three) times daily as needed for moderate pain.  Dispense: 90 tablet; Refill: 0 - HYDROcodone-acetaminophen (NORCO) 10-325 MG tablet; Take 1 tablet by mouth 3 (three) times daily as needed for severe pain.  Dispense: 90 tablet; Refill: 0 - HYDROcodone-acetaminophen (NORCO) 10-325 MG tablet; Take 1 tablet by mouth 3 (three) times daily as needed for severe pain.  Dispense: 90 tablet; Refill: 0 - CBC with Differential/Platelet - CMP14+EGFR - ToxASSURE Select 13 (MW), Urine - gabapentin (NEURONTIN) 400 MG capsule; TAKE TWO CAPSULES THREE TIMES DAILY  Dispense: 120 capsule; Refill: 5 - meloxicam (MOBIC) 7.5 MG tablet; Take 1 tablet  (7.5 mg total) by mouth 2 (two) times daily.  Dispense: 180 tablet; Refill: 3  6. Simple chronic bronchitis ([place]) encouraged moking ceation  7. Other migraine with status migrainosus, not intractable - topiramate (TOPAMAX) 100 MG tablet; Take 1 tablet (100 mg total) by mouth 2 (two) times daily.  Dispense: 60 tablet; Refill: 0     Labs pending Health Maintenance reviewed Diet and exercise encouraged  Follow up plan: 3 months   Mary-Margaret Hassell Done, FNP

## 2019-08-02 NOTE — Patient Instructions (Signed)
Steps to Quit Smoking Smoking tobacco is the leading cause of preventable death. It can affect almost every organ in the body. Smoking puts you and people around you at risk for many serious, long-lasting (chronic) diseases. Quitting smoking can be hard, but it is one of the best things that you can do for your health. It is never too late to quit. How do I get ready to quit? When you decide to quit smoking, make a plan to help you succeed. Before you quit:  Pick a date to quit. Set a date within the next 2 weeks to give you time to prepare.  Write down the reasons why you are quitting. Keep this list in places where you will see it often.  Tell your family, friends, and co-workers that you are quitting. Their support is important.  Talk with your doctor about the choices that may help you quit.  Find out if your health insurance will pay for these treatments.  Know the people, places, things, and activities that make you want to smoke (triggers). Avoid them. What first steps can I take to quit smoking?  Throw away all cigarettes at home, at work, and in your car.  Throw away the things that you use when you smoke, such as ashtrays and lighters.  Clean your car. Make sure to empty the ashtray.  Clean your home, including curtains and carpets. What can I do to help me quit smoking? Talk with your doctor about taking medicines and seeing a counselor at the same time. You are more likely to succeed when you do both.  If you are pregnant or breastfeeding, talk with your doctor about counseling or other ways to quit smoking. Do not take medicine to help you quit smoking unless your doctor tells you to do so. To quit smoking: Quit right away  Quit smoking totally, instead of slowly cutting back on how much you smoke over a period of time.  Go to counseling. You are more likely to quit if you go to counseling sessions regularly. Take medicine You may take medicines to help you quit. Some  medicines need a prescription, and some you can buy over-the-counter. Some medicines may contain a drug called nicotine to replace the nicotine in cigarettes. Medicines may:  Help you to stop having the desire to smoke (cravings).  Help to stop the problems that come when you stop smoking (withdrawal symptoms). Your doctor may ask you to use:  Nicotine patches, gum, or lozenges.  Nicotine inhalers or sprays.  Non-nicotine medicine that is taken by mouth. Find resources Find resources and other ways to help you quit smoking and remain smoke-free after you quit. These resources are most helpful when you use them often. They include:  Online chats with a counselor.  Phone quitlines.  Printed self-help materials.  Support groups or group counseling.  Text messaging programs.  Mobile phone apps. Use apps on your mobile phone or tablet that can help you stick to your quit plan. There are many free apps for mobile phones and tablets as well as websites. Examples include Quit Guide from the CDC and smokefree.gov  What things can I do to make it easier to quit?   Talk to your family and friends. Ask them to support and encourage you.  Call a phone quitline (1-800-QUIT-NOW), reach out to support groups, or work with a counselor.  Ask people who smoke to not smoke around you.  Avoid places that make you want to smoke,   such as: ? Bars. ? Parties. ? Smoke-break areas at work.  Spend time with people who do not smoke.  Lower the stress in your life. Stress can make you want to smoke. Try these things to help your stress: ? Getting regular exercise. ? Doing deep-breathing exercises. ? Doing yoga. ? Meditating. ? Doing a body scan. To do this, close your eyes, focus on one area of your body at a time from head to toe. Notice which parts of your body are tense. Try to relax the muscles in those areas. How will I feel when I quit smoking? Day 1 to 3 weeks Within the first 24 hours,  you may start to have some problems that come from quitting tobacco. These problems are very bad 2-3 days after you quit, but they do not often last for more than 2-3 weeks. You may get these symptoms:  Mood swings.  Feeling restless, nervous, angry, or annoyed.  Trouble concentrating.  Dizziness.  Strong desire for high-sugar foods and nicotine.  Weight gain.  Trouble pooping (constipation).  Feeling like you may vomit (nausea).  Coughing or a sore throat.  Changes in how the medicines that you take for other issues work in your body.  Depression.  Trouble sleeping (insomnia). Week 3 and afterward After the first 2-3 weeks of quitting, you may start to notice more positive results, such as:  Better sense of smell and taste.  Less coughing and sore throat.  Slower heart rate.  Lower blood pressure.  Clearer skin.  Better breathing.  Fewer sick days. Quitting smoking can be hard. Do not give up if you fail the first time. Some people need to try a few times before they succeed. Do your best to stick to your quit plan, and talk with your doctor if you have any questions or concerns. Summary  Smoking tobacco is the leading cause of preventable death. Quitting smoking can be hard, but it is one of the best things that you can do for your health.  When you decide to quit smoking, make a plan to help you succeed.  Quit smoking right away, not slowly over a period of time.  When you start quitting, seek help from your doctor, family, or friends. This information is not intended to replace advice given to you by your health care provider. Make sure you discuss any questions you have with your health care provider. Document Revised: 11/06/2018 Document Reviewed: 05/02/2018 Elsevier Patient Education  2020 Elsevier Inc.  

## 2019-08-03 LAB — CBC WITH DIFFERENTIAL/PLATELET
Basophils Absolute: 0.1 10*3/uL (ref 0.0–0.2)
Basos: 1 %
EOS (ABSOLUTE): 0.1 10*3/uL (ref 0.0–0.4)
Eos: 1 %
Hematocrit: 46.9 % — ABNORMAL HIGH (ref 34.0–46.6)
Hemoglobin: 15.7 g/dL (ref 11.1–15.9)
Immature Grans (Abs): 0 10*3/uL (ref 0.0–0.1)
Immature Granulocytes: 0 %
Lymphocytes Absolute: 2.6 10*3/uL (ref 0.7–3.1)
Lymphs: 25 %
MCH: 32.8 pg (ref 26.6–33.0)
MCHC: 33.5 g/dL (ref 31.5–35.7)
MCV: 98 fL — ABNORMAL HIGH (ref 79–97)
Monocytes Absolute: 0.5 10*3/uL (ref 0.1–0.9)
Monocytes: 5 %
Neutrophils Absolute: 7.1 10*3/uL — ABNORMAL HIGH (ref 1.4–7.0)
Neutrophils: 68 %
Platelets: 240 10*3/uL (ref 150–450)
RBC: 4.78 x10E6/uL (ref 3.77–5.28)
RDW: 12.5 % (ref 11.7–15.4)
WBC: 10.5 10*3/uL (ref 3.4–10.8)

## 2019-08-03 LAB — CMP14+EGFR
ALT: 10 IU/L (ref 0–32)
AST: 9 IU/L (ref 0–40)
Albumin/Globulin Ratio: 1.4 (ref 1.2–2.2)
Albumin: 4.2 g/dL (ref 3.8–4.8)
Alkaline Phosphatase: 69 IU/L (ref 48–121)
BUN/Creatinine Ratio: 21 (ref 9–23)
BUN: 17 mg/dL (ref 6–20)
Bilirubin Total: 0.2 mg/dL (ref 0.0–1.2)
CO2: 18 mmol/L — ABNORMAL LOW (ref 20–29)
Calcium: 9 mg/dL (ref 8.7–10.2)
Chloride: 107 mmol/L — ABNORMAL HIGH (ref 96–106)
Creatinine, Ser: 0.81 mg/dL (ref 0.57–1.00)
GFR calc Af Amer: 108 mL/min/{1.73_m2} (ref >59)
GFR calc non Af Amer: 94 mL/min/{1.73_m2} (ref >59)
Globulin, Total: 3 g/dL (ref 1.5–4.5)
Glucose: 83 mg/dL (ref 65–99)
Potassium: 4.4 mmol/L (ref 3.5–5.2)
Sodium: 138 mmol/L (ref 134–144)
Total Protein: 7.2 g/dL (ref 6.0–8.5)

## 2019-08-05 LAB — TOXASSURE SELECT 13 (MW), URINE

## 2019-09-23 DIAGNOSIS — Z23 Encounter for immunization: Secondary | ICD-10-CM | POA: Diagnosis not present

## 2019-10-15 DIAGNOSIS — Z23 Encounter for immunization: Secondary | ICD-10-CM | POA: Diagnosis not present

## 2019-10-29 ENCOUNTER — Encounter: Payer: Self-pay | Admitting: Nurse Practitioner

## 2019-10-29 ENCOUNTER — Other Ambulatory Visit: Payer: Self-pay

## 2019-10-29 ENCOUNTER — Ambulatory Visit (INDEPENDENT_AMBULATORY_CARE_PROVIDER_SITE_OTHER): Payer: BC Managed Care – PPO | Admitting: Nurse Practitioner

## 2019-10-29 VITALS — BP 126/82 | HR 66 | Temp 98.3°F | Resp 20 | Ht 68.0 in | Wt 177.0 lb

## 2019-10-29 DIAGNOSIS — G43801 Other migraine, not intractable, with status migrainosus: Secondary | ICD-10-CM

## 2019-10-29 DIAGNOSIS — F3341 Major depressive disorder, recurrent, in partial remission: Secondary | ICD-10-CM | POA: Diagnosis not present

## 2019-10-29 DIAGNOSIS — J410 Simple chronic bronchitis: Secondary | ICD-10-CM

## 2019-10-29 DIAGNOSIS — G2581 Restless legs syndrome: Secondary | ICD-10-CM

## 2019-10-29 DIAGNOSIS — M357 Hypermobility syndrome: Secondary | ICD-10-CM

## 2019-10-29 DIAGNOSIS — F4310 Post-traumatic stress disorder, unspecified: Secondary | ICD-10-CM | POA: Diagnosis not present

## 2019-10-29 DIAGNOSIS — F411 Generalized anxiety disorder: Secondary | ICD-10-CM

## 2019-10-29 MED ORDER — TOPIRAMATE 100 MG PO TABS: 100.0000 mg | ORAL_TABLET | Freq: Two times a day (BID) | ORAL | 1 refills | Status: DC

## 2019-10-29 MED ORDER — VENLAFAXINE HCL ER 75 MG PO CP24: 150.0000 mg | ORAL_CAPSULE | Freq: Every day | ORAL | 1 refills | Status: DC

## 2019-10-29 MED ORDER — GABAPENTIN 400 MG PO CAPS: ORAL_CAPSULE | ORAL | 5 refills | Status: DC

## 2019-10-29 MED ORDER — HYDROCODONE-ACETAMINOPHEN 10-325 MG PO TABS: 1.0000 | ORAL_TABLET | Freq: Three times a day (TID) | ORAL | 0 refills | Status: AC | PRN

## 2019-10-29 MED ORDER — HYDROCODONE-ACETAMINOPHEN 10-325 MG PO TABS: 1.0000 | ORAL_TABLET | Freq: Three times a day (TID) | ORAL | 0 refills | Status: DC | PRN

## 2019-10-29 MED ORDER — ROPINIROLE HCL 2 MG PO TABS: ORAL_TABLET | ORAL | 1 refills | Status: DC

## 2019-10-29 NOTE — Patient Instructions (Signed)
Steps to Quit Smoking Smoking tobacco is the leading cause of preventable death. It can affect almost every organ in the body. Smoking puts you and people around you at risk for many serious, long-lasting (chronic) diseases. Quitting smoking can be hard, but it is one of the best things that you can do for your health. It is never too late to quit. How do I get ready to quit? When you decide to quit smoking, make a plan to help you succeed. Before you quit:  Pick a date to quit. Set a date within the next 2 weeks to give you time to prepare.  Write down the reasons why you are quitting. Keep this list in places where you will see it often.  Tell your family, friends, and co-workers that you are quitting. Their support is important.  Talk with your doctor about the choices that may help you quit.  Find out if your health insurance will pay for these treatments.  Know the people, places, things, and activities that make you want to smoke (triggers). Avoid them. What first steps can I take to quit smoking?  Throw away all cigarettes at home, at work, and in your car.  Throw away the things that you use when you smoke, such as ashtrays and lighters.  Clean your car. Make sure to empty the ashtray.  Clean your home, including curtains and carpets. What can I do to help me quit smoking? Talk with your doctor about taking medicines and seeing a counselor at the same time. You are more likely to succeed when you do both.  If you are pregnant or breastfeeding, talk with your doctor about counseling or other ways to quit smoking. Do not take medicine to help you quit smoking unless your doctor tells you to do so. To quit smoking: Quit right away  Quit smoking totally, instead of slowly cutting back on how much you smoke over a period of time.  Go to counseling. You are more likely to quit if you go to counseling sessions regularly. Take medicine You may take medicines to help you quit. Some  medicines need a prescription, and some you can buy over-the-counter. Some medicines may contain a drug called nicotine to replace the nicotine in cigarettes. Medicines may:  Help you to stop having the desire to smoke (cravings).  Help to stop the problems that come when you stop smoking (withdrawal symptoms). Your doctor may ask you to use:  Nicotine patches, gum, or lozenges.  Nicotine inhalers or sprays.  Non-nicotine medicine that is taken by mouth. Find resources Find resources and other ways to help you quit smoking and remain smoke-free after you quit. These resources are most helpful when you use them often. They include:  Online chats with a counselor.  Phone quitlines.  Printed self-help materials.  Support groups or group counseling.  Text messaging programs.  Mobile phone apps. Use apps on your mobile phone or tablet that can help you stick to your quit plan. There are many free apps for mobile phones and tablets as well as websites. Examples include Quit Guide from the CDC and smokefree.gov  What things can I do to make it easier to quit?   Talk to your family and friends. Ask them to support and encourage you.  Call a phone quitline (1-800-QUIT-NOW), reach out to support groups, or work with a counselor.  Ask people who smoke to not smoke around you.  Avoid places that make you want to smoke,   such as: ? Bars. ? Parties. ? Smoke-break areas at work.  Spend time with people who do not smoke.  Lower the stress in your life. Stress can make you want to smoke. Try these things to help your stress: ? Getting regular exercise. ? Doing deep-breathing exercises. ? Doing yoga. ? Meditating. ? Doing a body scan. To do this, close your eyes, focus on one area of your body at a time from head to toe. Notice which parts of your body are tense. Try to relax the muscles in those areas. How will I feel when I quit smoking? Day 1 to 3 weeks Within the first 24 hours,  you may start to have some problems that come from quitting tobacco. These problems are very bad 2-3 days after you quit, but they do not often last for more than 2-3 weeks. You may get these symptoms:  Mood swings.  Feeling restless, nervous, angry, or annoyed.  Trouble concentrating.  Dizziness.  Strong desire for high-sugar foods and nicotine.  Weight gain.  Trouble pooping (constipation).  Feeling like you may vomit (nausea).  Coughing or a sore throat.  Changes in how the medicines that you take for other issues work in your body.  Depression.  Trouble sleeping (insomnia). Week 3 and afterward After the first 2-3 weeks of quitting, you may start to notice more positive results, such as:  Better sense of smell and taste.  Less coughing and sore throat.  Slower heart rate.  Lower blood pressure.  Clearer skin.  Better breathing.  Fewer sick days. Quitting smoking can be hard. Do not give up if you fail the first time. Some people need to try a few times before they succeed. Do your best to stick to your quit plan, and talk with your doctor if you have any questions or concerns. Summary  Smoking tobacco is the leading cause of preventable death. Quitting smoking can be hard, but it is one of the best things that you can do for your health.  When you decide to quit smoking, make a plan to help you succeed.  Quit smoking right away, not slowly over a period of time.  When you start quitting, seek help from your doctor, family, or friends. This information is not intended to replace advice given to you by your health care provider. Make sure you discuss any questions you have with your health care provider. Document Revised: 11/06/2018 Document Reviewed: 05/02/2018 Elsevier Patient Education  2020 Elsevier Inc.  
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
## 2019-11-02 ENCOUNTER — Telehealth: Payer: Self-pay | Admitting: Nurse Practitioner

## 2019-11-02 NOTE — Telephone Encounter (Signed)
Is there anything special you need for patient to do before appointment is made

## 2019-11-02 NOTE — Telephone Encounter (Signed)
Are you going to insert for patient?

## 2019-11-02 NOTE — Telephone Encounter (Signed)
No she needs appointment with dettinger. Cynthia Hayden has talke with his nurse about this.

## 2019-11-02 NOTE — Telephone Encounter (Signed)
I can do a Nexplanon for her and we could get her on the schedule, it looks like in the past she has had Depo-Provera injections, if she did well with the Depo-Provera injections then we could do a Nexplanon for her in the future.  I am okay to go ahead and schedule her, if she is still actively getting Depo-Provera then we can schedule her whenever, if she is not currently on the Depo-Provera and would like to do the Nexplanon then schedule an appointment and have her come in 2 weeks before for a urine pregnancy test and we will do another one on the day of the visit and have no intercourse in the 2 weeks between.

## 2019-11-03 NOTE — Telephone Encounter (Signed)
Left message for pt to return call.  Schedule appt with Dettinger for Nexplanon insertion if pt wishes ( next available)  Pt should come in 2 weeks prior to appt for urine preg   Pt should remain abstinent before procedure date.

## 2019-11-04 NOTE — Telephone Encounter (Signed)
rc for nurse 

## 2019-11-12 ENCOUNTER — Other Ambulatory Visit: Payer: Self-pay

## 2019-11-12 DIAGNOSIS — Z3046 Encounter for surveillance of implantable subdermal contraceptive: Secondary | ICD-10-CM

## 2019-11-12 NOTE — Telephone Encounter (Signed)
Pt is scheduled for Nexplanon insertion 12/30/19 in a 30 min slot with Dettinger. She is currently not on any form of BC.   Pt will come to lab 2 weeks prior to insertion on 12/16/19 for urine preg. She is aware to be abstinent until her appt on 12/30/19. Another urine preg will be performed the day of procedure.  Pt's LMP 10/29/2019. Pt admits to not coming in on time for Depo injections. This is why she requests depo.  Nursing supervisors aware to order Nexplanon.

## 2019-12-15 ENCOUNTER — Other Ambulatory Visit: Payer: BC Managed Care – PPO

## 2019-12-15 ENCOUNTER — Other Ambulatory Visit: Payer: Self-pay

## 2019-12-15 ENCOUNTER — Ambulatory Visit (INDEPENDENT_AMBULATORY_CARE_PROVIDER_SITE_OTHER): Payer: BC Managed Care – PPO

## 2019-12-15 DIAGNOSIS — Z23 Encounter for immunization: Secondary | ICD-10-CM | POA: Diagnosis not present

## 2019-12-21 ENCOUNTER — Ambulatory Visit: Payer: BC Managed Care – PPO

## 2019-12-30 ENCOUNTER — Encounter: Payer: Self-pay | Admitting: Family Medicine

## 2019-12-30 ENCOUNTER — Other Ambulatory Visit: Payer: Self-pay

## 2019-12-30 ENCOUNTER — Ambulatory Visit: Payer: BC Managed Care – PPO | Admitting: Family Medicine

## 2019-12-30 VITALS — BP 127/82 | HR 67 | Temp 97.8°F | Ht 68.0 in | Wt 184.4 lb

## 2019-12-30 DIAGNOSIS — Z3046 Encounter for surveillance of implantable subdermal contraceptive: Secondary | ICD-10-CM

## 2019-12-30 LAB — PREGNANCY, URINE: Preg Test, Ur: NEGATIVE

## 2019-12-30 NOTE — Progress Notes (Signed)
   BP 127/82   Pulse 67   Temp 97.8 F (36.6 C) (Temporal)   Ht 5\' 8"  (1.727 m)   Wt 184 lb 6.4 oz (83.6 kg)   BMI 28.04 kg/m    Subjective:   Patient ID: , female    DOB: [birth year], 39 y.o.   MRN: [id number]  HPI: Cynthia Hayden is a 39 y.o. female presenting on 12/30/2019 for Contraception   HPI Patient is coming in for birth control and wants nexplanon, had urine pregnancy  Relevant past medical, surgical, family and social history reviewed and updated as indicated. Interim medical history since our last visit reviewed. Allergies and medications reviewed and updated.  Review of Systems  Genitourinary: Negative for menstrual problem and pelvic pain.    Per HPI unless specifically indicated above      Objective:   BP 127/82   Pulse 67   Temp 97.8 F (36.6 C) (Temporal)   Ht 5\' 8"  (1.727 m)   Wt 184 lb 6.4 oz (83.6 kg)   BMI 28.04 kg/m   Wt Readings from Last 3 Encounters:  12/30/19 184 lb 6.4 oz (83.6 kg)  10/29/19 177 lb (80.3 kg)  08/02/19 163 lb (73.9 kg)    Physical Exam Vitals and nursing note reviewed.  Constitutional:      Appearance: Normal appearance.  Skin:      Neurological:     Mental Status: She is alert.    Urine pregnancy negative  Nexplanon insertion: Patient educated on Nexplanon birth control and its side effects and the side effects from the procedure. Patient wishes to continue. Left inner arm prepped with Betadine. 4 mL of 2% lidocaine without epinephrine used for anesthesia. Device was prepped using sterile procedure. Nexplanon was inserted using package and Company directions. Steri-Strips were applied. Bleeding was minimal and patient tolerated procedure well.  Assessment & Plan:   Problem List Items Addressed This Visit      Other   Encounter for contraceptive management - Primary   Relevant Orders   Pregnancy, urine       Follow up plan: Return if symptoms worsen or fail to improve.  Counseling  provided for all of the vaccine components Orders Placed This Encounter  Procedures  . Pregnancy, urine    12/29/19, MD [facility] 12/30/2019, 2:21 PM

## 2020-01-27 ENCOUNTER — Ambulatory Visit: Payer: BC Managed Care – PPO | Admitting: Nurse Practitioner

## 2020-01-27 ENCOUNTER — Encounter: Payer: Self-pay | Admitting: Nurse Practitioner

## 2020-01-27 ENCOUNTER — Other Ambulatory Visit: Payer: Self-pay

## 2020-01-27 VITALS — BP 122/79 | HR 83 | Temp 98.2°F | Resp 20 | Ht 68.0 in | Wt 186.0 lb

## 2020-01-27 DIAGNOSIS — F3341 Major depressive disorder, recurrent, in partial remission: Secondary | ICD-10-CM | POA: Diagnosis not present

## 2020-01-27 DIAGNOSIS — M357 Hypermobility syndrome: Secondary | ICD-10-CM

## 2020-01-27 DIAGNOSIS — J410 Simple chronic bronchitis: Secondary | ICD-10-CM

## 2020-01-27 DIAGNOSIS — F411 Generalized anxiety disorder: Secondary | ICD-10-CM

## 2020-01-27 DIAGNOSIS — G43801 Other migraine, not intractable, with status migrainosus: Secondary | ICD-10-CM

## 2020-01-27 DIAGNOSIS — F4310 Post-traumatic stress disorder, unspecified: Secondary | ICD-10-CM

## 2020-01-27 DIAGNOSIS — G2581 Restless legs syndrome: Secondary | ICD-10-CM | POA: Diagnosis not present

## 2020-01-27 MED ORDER — TOPIRAMATE 100 MG PO TABS: 100.0000 mg | ORAL_TABLET | Freq: Two times a day (BID) | ORAL | 1 refills | Status: DC

## 2020-01-27 MED ORDER — VENLAFAXINE HCL ER 75 MG PO CP24: 150.0000 mg | ORAL_CAPSULE | Freq: Every day | ORAL | 1 refills | Status: DC

## 2020-01-27 MED ORDER — HYDROCODONE-ACETAMINOPHEN 10-325 MG PO TABS: 1.0000 | ORAL_TABLET | Freq: Three times a day (TID) | ORAL | 0 refills | Status: AC | PRN

## 2020-01-27 MED ORDER — ROPINIROLE HCL 2 MG PO TABS: ORAL_TABLET | ORAL | 1 refills | Status: DC

## 2020-01-27 MED ORDER — HYDROCODONE-ACETAMINOPHEN 10-325 MG PO TABS: 1.0000 | ORAL_TABLET | Freq: Three times a day (TID) | ORAL | 0 refills | Status: DC | PRN

## 2020-01-27 MED ORDER — GABAPENTIN 400 MG PO CAPS: ORAL_CAPSULE | ORAL | 5 refills | Status: DC

## 2020-01-27 NOTE — Progress Notes (Signed)
Subjective:    Patient ID: Cynthia Hayden, female    DOB: June 19, 1980, 39 y.o.   MRN: [id number]  Chief Complaint: Medical Management of Chronic Issues    HPI:  1. Recurrent major depressive disorder, in partial remission (HCC) Is on effexor . She has been on this for awhile. No medication siode effects. Depression screen [facility] 2/9 01/27/2020 12/30/2019 10/29/2019  Decreased Interest 0 0 1  Down, Depressed, Hopeless 1 0 0  PHQ - 2 Score 1 0 1  Altered sleeping 3 - 1  Tired, decreased energy 3 - 1  Change in appetite 0 - 0  Feeling bad or failure about yourself  0 - 0  Trouble concentrating 0 - 0  Moving slowly or fidgety/restless 0 - 0  Suicidal thoughts 0 - 0  PHQ-9 Score 7 - 3  Difficult doing work/chores Not difficult at all - Not difficult at all     2. GAD (generalized anxiety disorder) She is not on any controlled meds for this at this time. The effexor helps she says. GAD 7 : Generalized Anxiety Score 01/27/2020 10/29/2019 08/02/2019 04/26/2019  Nervous, Anxious, on Edge 1 0 0 1  Control/stop worrying 1 0 0 1  Worry too much - different things 1 1 0 1  Trouble relaxing 1 0 0 0  Restless 0 0 0 1  Easily annoyed or irritable 1 1 0 1  Afraid - awful might happen 0 1 0 1  Total GAD 7 Score 5 3 0 6  Anxiety Difficulty Not difficult at all Not difficult at all - Somewhat difficult      3. PTSD (post-traumatic stress disorder) She says she is alright she guesses. Is not having any panic attacks.  4. Restless legs She is on requip and that seems to help her. meds make her sleepy.  5. Simple chronic bronchitis (HCC) Denies any sob or cough. Smokes over a pack a day. Sometimes 2 packs a day.  6. Benign joint hypermobility syndrome *takes gabapentin which seems to help. She has tried going up on meds but that caused side effects. She is currently on 2 a day Pain assessment: Cause of pain- hypermobility of joints Pain location- moves around from day t day. knnes seem to be the  worst Pain on scale of 1-10- 6/10 currently Frequency- daiy What increases pain-nothing seems to make it worse What makes pain Better-rest Effects on ADL - none Any change in general medical condition-none  Current opioids rx- norco 10/325TID  # meds rx- 90 Effectiveness of current meds-helps Adverse reactions from pain meds-none Morphine equivalent-  Pill count performed-No Last drug screen - 08/02/19 ( high risk q28m, moderate risk q78m, low risk yearly ) Urine drug screen today- No Was the NCCSR reviewed- yes  If yes were their any concerning findings? - no   Overdose risk: 1   Pain contract signed on:08/04/19     Outpatient Encounter Medications as of 01/27/2020  Medication Sig  . albuterol (PROVENTIL HFA;VENTOLIN HFA) 108 (90 Base) MCG/ACT inhaler Inhale 2 puffs into the lungs every 6 (six) hours as needed for wheezing or shortness of breath.  . gabapentin (NEURONTIN) 400 MG capsule TAKE TWO CAPSULES THREE TIMES DAILY  . HYDROcodone-acetaminophen (NORCO) 10-325 MG tablet Take 1 tablet by mouth every 8 (eight) hours as needed.  . meloxicam (MOBIC) 7.5 MG tablet Take 1 tablet (7.5 mg total) by mouth 2 (two) times daily.  Marland Kitchen rOPINIRole (REQUIP) 2 MG tablet TAKE 1 TO  2 TABLETS AT BEDTIME  . SUMAtriptan (IMITREX) 100 MG tablet Take 100 mg by mouth every 2 (two) hours as needed for migraine. May repeat in 2 hours if headache persists or recurs.  . topiramate (TOPAMAX) 100 MG tablet Take 1 tablet (100 mg total) by mouth 2 (two) times daily.  Marland Kitchen venlafaxine XR (EFFEXOR-XR) 75 MG 24 hr capsule Take 2 capsules (150 mg total) by mouth daily with breakfast.     History reviewed. No pertinent surgical history.  Family History  Problem Relation Age of Onset  . Diabetes Mother     New complaints: None today  Social history: Lives with her daughter , son and husband     Review of Systems  Constitutional: Negative for diaphoresis.  Eyes: Negative for pain.   Respiratory: Negative for shortness of breath.   Cardiovascular: Negative for chest pain, palpitations and leg swelling.  Gastrointestinal: Negative for abdominal pain.  Endocrine: Negative for polydipsia.  Musculoskeletal: Positive for arthralgias.  Skin: Negative for rash.  Neurological: Negative for dizziness, weakness and headaches.  Hematological: Does not bruise/bleed easily.  All other systems reviewed and are negative.      Objective:   Physical Exam Vitals and nursing note reviewed.  Constitutional:      General: She is not in acute distress.    Appearance: Normal appearance. She is well-developed.  HENT:     Head: Normocephalic.     Nose: Nose normal.  Eyes:     Pupils: Pupils are equal, round, and reactive to light.  Neck:     Vascular: No carotid bruit or JVD.  Cardiovascular:     Rate and Rhythm: Normal rate and regular rhythm.     Heart sounds: Normal heart sounds.  Pulmonary:     Effort: Pulmonary effort is normal. No respiratory distress.     Breath sounds: Normal breath sounds. No wheezing or rales.  Chest:     Chest wall: No tenderness.  Abdominal:     General: Bowel sounds are normal. There is no distension or abdominal bruit.     Palpations: Abdomen is soft. There is no hepatomegaly, splenomegaly, mass or pulsatile mass.     Tenderness: There is no abdominal tenderness.  Musculoskeletal:        General: Normal range of motion.     Cervical back: Normal range of motion and neck supple.     Comments: Rises slowly from sitting to standing  Lymphadenopathy:     Cervical: No cervical adenopathy.  Skin:    General: Skin is warm and dry.  Neurological:     Mental Status: She is alert and oriented to person, place, and time.     Deep Tendon Reflexes: Reflexes are normal and symmetric.  Psychiatric:        Behavior: Behavior normal.        Thought Content: Thought content normal.        Judgment: Judgment normal.     BP 122/79   Pulse 83   Temp  98.2 F (36.8 C) (Temporal)   Resp 20   Ht 5\' 8"  (1.727 m)   Wt 186 lb (84.4 kg)   SpO2 100%   BMI 28.28 kg/m        Assessment & Plan:  Cynthia Hayden comes in today with chief complaint of Medical Management of Chronic Issues   Diagnosis and orders addressed:  1. Recurrent major depressive disorder, in partial remission (HCC) Stress management - venlafaxine XR (EFFEXOR-XR) 75 MG 24  hr capsule; Take 2 capsules (150 mg total) by mouth daily with breakfast.  Dispense: 180 capsule; Refill: 1  2. GAD (generalized anxiety disorder)  3. PTSD (post-traumatic stress disorder)  4. Restless legs Keep legs warm at night - rOPINIRole (REQUIP) 2 MG tablet; TAKE 1 TO 2 TABLETS AT BEDTIME  Dispense: 180 tablet; Refill: 1  5. Simple chronic bronchitis (HCC) Smoking cesation encouraged  6. Benign joint hypermobility syndrome - HYDROcodone-acetaminophen (NORCO) 10-325 MG tablet; Take 1 tablet by mouth every 8 (eight) hours as needed.  Dispense: 90 tablet; Refill: 0 - gabapentin (NEURONTIN) 400 MG capsule; TAKE TWO CAPSULES THREE TIMES DAILY  Dispense: 120 capsule; Refill: 5 - HYDROcodone-acetaminophen (NORCO) 10-325 MG tablet; Take 1 tablet by mouth every 8 (eight) hours as needed.  Dispense: 90 tablet; Refill: 0 - HYDROcodone-acetaminophen (NORCO) 10-325 MG tablet; Take 1 tablet by mouth every 8 (eight) hours as needed.  Dispense: 90 tablet; Refill: 0  7. Other migraine with status migrainosus, not intractable - topiramate (TOPAMAX) 100 MG tablet; Take 1 tablet (100 mg total) by mouth 2 (two) times daily.  Dispense: 180 tablet; Refill: 1   Labs pending Health Maintenance reviewed Diet and exercise encouraged  Follow up plan: 3 months   Mary-Margaret Daphine Deutscher, FNP

## 2020-01-27 NOTE — Patient Instructions (Signed)
Steps to Quit Smoking Smoking tobacco is the leading cause of preventable death. It can affect almost every organ in the body. Smoking puts you and people around you at risk for many serious, long-lasting (chronic) diseases. Quitting smoking can be hard, but it is one of the best things that you can do for your health. It is never too late to quit. How do I get ready to quit? When you decide to quit smoking, make a plan to help you succeed. Before you quit:  Pick a date to quit. Set a date within the next 2 weeks to give you time to prepare.  Write down the reasons why you are quitting. Keep this list in places where you will see it often.  Tell your family, friends, and co-workers that you are quitting. Their support is important.  Talk with your doctor about the choices that may help you quit.  Find out if your health insurance will pay for these treatments.  Know the people, places, things, and activities that make you want to smoke (triggers). Avoid them. What first steps can I take to quit smoking?  Throw away all cigarettes at home, at work, and in your car.  Throw away the things that you use when you smoke, such as ashtrays and lighters.  Clean your car. Make sure to empty the ashtray.  Clean your home, including curtains and carpets. What can I do to help me quit smoking? Talk with your doctor about taking medicines and seeing a counselor at the same time. You are more likely to succeed when you do both.  If you are pregnant or breastfeeding, talk with your doctor about counseling or other ways to quit smoking. Do not take medicine to help you quit smoking unless your doctor tells you to do so. To quit smoking: Quit right away  Quit smoking totally, instead of slowly cutting back on how much you smoke over a period of time.  Go to counseling. You are more likely to quit if you go to counseling sessions regularly. Take medicine You may take medicines to help you quit. Some  medicines need a prescription, and some you can buy over-the-counter. Some medicines may contain a drug called nicotine to replace the nicotine in cigarettes. Medicines may:  Help you to stop having the desire to smoke (cravings).  Help to stop the problems that come when you stop smoking (withdrawal symptoms). Your doctor may ask you to use:  Nicotine patches, gum, or lozenges.  Nicotine inhalers or sprays.  Non-nicotine medicine that is taken by mouth. Find resources Find resources and other ways to help you quit smoking and remain smoke-free after you quit. These resources are most helpful when you use them often. They include:  Online chats with a counselor.  Phone quitlines.  Printed self-help materials.  Support groups or group counseling.  Text messaging programs.  Mobile phone apps. Use apps on your mobile phone or tablet that can help you stick to your quit plan. There are many free apps for mobile phones and tablets as well as websites. Examples include Quit Guide from the CDC and smokefree.gov  What things can I do to make it easier to quit?   Talk to your family and friends. Ask them to support and encourage you.  Call a phone quitline (1-800-QUIT-NOW), reach out to support groups, or work with a counselor.  Ask people who smoke to not smoke around you.  Avoid places that make you want to smoke,   such as: ? Bars. ? Parties. ? Smoke-break areas at work.  Spend time with people who do not smoke.  Lower the stress in your life. Stress can make you want to smoke. Try these things to help your stress: ? Getting regular exercise. ? Doing deep-breathing exercises. ? Doing yoga. ? Meditating. ? Doing a body scan. To do this, close your eyes, focus on one area of your body at a time from head to toe. Notice which parts of your body are tense. Try to relax the muscles in those areas. How will I feel when I quit smoking? Day 1 to 3 weeks Within the first 24 hours,  you may start to have some problems that come from quitting tobacco. These problems are very bad 2-3 days after you quit, but they do not often last for more than 2-3 weeks. You may get these symptoms:  Mood swings.  Feeling restless, nervous, angry, or annoyed.  Trouble concentrating.  Dizziness.  Strong desire for high-sugar foods and nicotine.  Weight gain.  Trouble pooping (constipation).  Feeling like you may vomit (nausea).  Coughing or a sore throat.  Changes in how the medicines that you take for other issues work in your body.  Depression.  Trouble sleeping (insomnia). Week 3 and afterward After the first 2-3 weeks of quitting, you may start to notice more positive results, such as:  Better sense of smell and taste.  Less coughing and sore throat.  Slower heart rate.  Lower blood pressure.  Clearer skin.  Better breathing.  Fewer sick days. Quitting smoking can be hard. Do not give up if you fail the first time. Some people need to try a few times before they succeed. Do your best to stick to your quit plan, and talk with your doctor if you have any questions or concerns. Summary  Smoking tobacco is the leading cause of preventable death. Quitting smoking can be hard, but it is one of the best things that you can do for your health.  When you decide to quit smoking, make a plan to help you succeed.  Quit smoking right away, not slowly over a period of time.  When you start quitting, seek help from your doctor, family, or friends. This information is not intended to replace advice given to you by your health care provider. Make sure you discuss any questions you have with your health care provider. Document Revised: 11/06/2018 Document Reviewed: 05/02/2018 Elsevier Patient Education  2020 Elsevier Inc.  

## 2020-05-01 ENCOUNTER — Ambulatory Visit: Payer: BC Managed Care – PPO | Admitting: Nurse Practitioner

## 2020-05-01 ENCOUNTER — Encounter: Payer: Self-pay | Admitting: Nurse Practitioner

## 2020-05-01 ENCOUNTER — Other Ambulatory Visit: Payer: Self-pay

## 2020-05-01 VITALS — BP 135/85 | HR 73 | Temp 97.9°F | Resp 20 | Ht 68.0 in | Wt 196.0 lb

## 2020-05-01 DIAGNOSIS — F3341 Major depressive disorder, recurrent, in partial remission: Secondary | ICD-10-CM

## 2020-05-01 DIAGNOSIS — J410 Simple chronic bronchitis: Secondary | ICD-10-CM | POA: Diagnosis not present

## 2020-05-01 DIAGNOSIS — G43801 Other migraine, not intractable, with status migrainosus: Secondary | ICD-10-CM

## 2020-05-01 DIAGNOSIS — F4310 Post-traumatic stress disorder, unspecified: Secondary | ICD-10-CM

## 2020-05-01 DIAGNOSIS — M357 Hypermobility syndrome: Secondary | ICD-10-CM

## 2020-05-01 DIAGNOSIS — F411 Generalized anxiety disorder: Secondary | ICD-10-CM | POA: Diagnosis not present

## 2020-05-01 DIAGNOSIS — G2581 Restless legs syndrome: Secondary | ICD-10-CM

## 2020-05-01 MED ORDER — HYDROCODONE-ACETAMINOPHEN 10-325 MG PO TABS: 1.0000 | ORAL_TABLET | Freq: Three times a day (TID) | ORAL | 0 refills | Status: AC | PRN

## 2020-05-01 MED ORDER — PRAMIPEXOLE DIHYDROCHLORIDE 1 MG PO TABS: 1.0000 mg | ORAL_TABLET | Freq: Three times a day (TID) | ORAL | 2 refills | Status: DC

## 2020-05-01 MED ORDER — HYDROCODONE-ACETAMINOPHEN 10-325 MG PO TABS: 1.0000 | ORAL_TABLET | Freq: Three times a day (TID) | ORAL | 0 refills | Status: DC | PRN

## 2020-05-01 NOTE — Patient Instructions (Signed)
Opioid Pain Medicine Management Opioid pain medicines are strong medicines that are used to treat bad or very bad pain. When you take them for a short time, they can help you:  Sleep better.  Do better in physical therapy.  Feel better during the first few days after you get hurt.  Recover from surgery. Only take these medicines if a doctor says that you can. You should only take them for a short time. This is because opioids can be hard to stop taking (they are addictive). The longer you take opioids, the harder it may be to stop taking them (opioid use disorder). What are the risks? Opioids can cause problems (side effects). Taking them for more than 3 days raises your chance of problems, such as:  Trouble pooping (constipation).  Feeling sick to your stomach (nausea).  Vomiting.  Feeling very sleepy.  Confusion.  Not being able to stop taking the medicine.  Breathing problems. Taking opioids for a long time can make it hard for you to do daily tasks. It can also put you at risk for:  Car accidents.  Depression.  Suicide.  Heart attack.  Taking too much of the medicine (overdose), which can sometimes lead to death. What is a pain treatment plan? A pain treatment plan is a plan made by you and your doctor. Work with your doctor to make a plan for treating your pain. To help you do this:  Talk about the goals of your treatment, including: ? How much pain you might expect to have. ? How you will manage the pain.  Talk about the risks and benefits of taking these medicines for your condition.  Remember that a good treatment plan uses more than one approach and lowers the risks of side effects.  Tell your doctor about the amount of medicines you take and about any drug or alcohol use.  Get your pain medicine prescriptions from only one doctor. Pain can be managed with other treatments. Work with your doctor to find other ways to help your pain, such as:  Physical  therapy.  Counseling.  Eating healthy foods.  Brain exercises.  Massage.  Meditation.  Other pain medicines.  Doing gentle exercises. Tapering your use of opioids If you have been taking opioids for more than a few weeks, you may need to slowly decrease (taper) how much you take until you stop taking them. Doing this can lower your chance of having symptoms, such as:  Pain and cramping in your belly (abdomen).  Feeling sick to your stomach.  Sweating.  Feeling very sleepy.  Feeling restless.  Shaking you cannot control (tremors).  Cravings for the medicine. Do not try to stop taking them by yourself. Work with your doctor to stop. Your doctor will help you take less until you are not taking the medicine at all. Follow these instructions at home: Safety and storage  While you are taking opioids: ? Do not drive. ? Do not use machines or power tools. ? Do not sign important papers (legal documents). ? Do not drink alcohol. ? Do not take sleeping pills. ? Do not take care of children by yourself. ? Do not do activities where you need to climb or be in high places, like working on a ladder. ? Do not go into any water, such as a lake, river, ocean, swimming pool, or hot tub.  Keep your opioids locked up or in a place where children cannot reach them.  Do not share your pain   medicine with anyone.   Getting rid of leftover pills Do not save any leftover pills. Get rid of leftover pills safely by:  Taking them to a take-back program in your area.  Bringing them to a pharmacy that has a container for throwing away pills (pill disposal).  Flushing them down the toilet. Check the label or package insert of your medicine to see whether this is safe to do.  Throwing them in the trash. Check the label or package insert of your medicine to see whether this is safe to do. If it is safe to throw them out: 1. Take the pills out of their container. 2. Put the pills into a  container you can seal. 3. Mix the pills with used coffee grounds, food scraps, dirt, or cat litter. 4. Put this in the trash. Activity  Return to your normal activities as told by your doctor. Ask your doctor what activities are safe for you.  Avoid doing things that make your pain worse.  Do exercises as told by your doctor. General instructions  You may need to take these actions to prevent or treat trouble pooping: ? Drink enough fluid to keep your pee (urine) pale yellow. ? Take over-the-counter or prescription medicines. ? Eat foods that are high in fiber. These include beans, whole grains, and fresh fruits and vegetables. ? Limit foods that are high in fat and sugar. These include fried or sweet foods.  Keep all follow-up visits as told by your doctor. This is important. Where to find support If you have been taking opioids for a long time, think about getting help quitting from a local support group or counselor. Ask your doctor about this. Where to find more information  Centers for Disease Control and Prevention (CDC): www.cdc.gov  [place] Food and Drug Administration (FDA): www.fda.gov Get help right away if: Seek medical care right away if you are taking opioids and you, or people close to you, notice any of the following:  You have trouble breathing.  Your breathing is slower or more shallow than normal.  You have a very slow heartbeat.  You feel very confused.  You pass out (faint).  You are very sleepy.  Your speech is not normal.  You feel sick to your stomach and vomit.  You have cold skin.  You have blue lips or fingernails.  Your muscles are weak (limp) and your body seems floppy.  The black centers of your eyes (pupils) are smaller than normal. If you think that you or someone else may have taken too much of an opioid medicine, get medical help right away. Call your local emergency services (911 in the [place] Do not drive yourself to the  hospital. If you ever feel like you may hurt yourself or others, or have thoughts about taking your own life, get help right away. You can go to your nearest emergency department or call:  Your local emergency services (911 in the [place]  The hotline of the National Poison Control Center (1-800-222-1222 in the [place]  A suicide crisis helpline, such as the National Suicide Prevention Lifeline at 1-800-273-8255. This is open 24 hours a day. Summary  Opioid are strong medicines that are used to treat bad or very bad pain.  A pain treatment plan is a plan made by you and your doctor. Work with your doctor to make a plan for treating your pain.  Work with your doctor to find other ways to help your pain.  If you   think that you or someone else may have taken too much of an opioid, get help right away. This information is not intended to replace advice given to you by your health care provider. Make sure you discuss any questions you have with your health care provider. Document Revised: 12/18/2018 Document Reviewed: 03/13/2018 Elsevier Patient Education  2021 Elsevier Inc.  

## 2020-05-01 NOTE — Progress Notes (Signed)
Subjective:    Patient ID: Cynthia Hayden, female    DOB: 10/29/1980, 40 y.o.   MRN: [id number]   Chief Complaint: Medical Management of Chronic Issues    HPI:  1. Simple chronic bronchitis (HCC) Is doing well. Denies a cough or sob. Is on no inhalers.  2. Other migraine with status migrainosus, not intractable Has not had but 1 migraine since last visit. imitrex helps. Is still on topamax daily.  3. GAD (generalized anxiety disorder) Is on effexor daily and is doing well.  GAD 7 : Generalized Anxiety Score 05/01/2020 01/27/2020 10/29/2019 08/02/2019  Nervous, Anxious, on Edge 1 1 0 0  Control/stop worrying 1 1 0 0  Worry too much - different things 1 1 1  0  Trouble relaxing 1 1 0 0  Restless 0 0 0 0  Easily annoyed or irritable 2 1 1  0  Afraid - awful might happen 0 0 1 0  Total GAD 7 Score 6 5 3  0  Anxiety Difficulty Somewhat difficult Not difficult at all Not difficult at all -       4. Recurrent major depressive disorder, in partial remission (HCC) Again she is on effexor an dis doing OK. Denies any medication side effects. Depression screen [facility] 2/9 05/01/2020 01/27/2020 12/30/2019  Decreased Interest 0 0 0  Down, Depressed, Hopeless 1 1 0  PHQ - 2 Score 1 1 0  Altered sleeping 3 3 -  Tired, decreased energy 3 3 -  Change in appetite 0 0 -  Feeling bad or failure about yourself  0 0 -  Trouble concentrating 1 0 -  Moving slowly or fidgety/restless 0 0 -  Suicidal thoughts 0 0 -  PHQ-9 Score 8 7 -  Difficult doing work/chores Not difficult at all Not difficult at all -     5. PTSD (post-traumatic stress disorder) No change sin demeanor  6. Restless legs Is on mirapex at night and has not reallly helping.  7. Benign joint hypermobility syndrome Causes all over joint pain. She has been on pain meds for several years. Pain assessment: Cause of pain- hypermobility Pain location- legs and fingers are the worst Pain on scale of 1-10- 5/10 today Frequency-  daily What increases pain-nothing really makes it worse What makes pain Better-nothing really changes it. Effects on ADL - none really Any change in general medical condition-none  Current opioids rx- norco 10/325 3x a day # meds rx- 90 Effectiveness of current meds-helps Adverse reactions from pain meds-none Morphine equivalent- 07/01/2020  Pill count performed-No Last drug screen - 08/02/19 ( high risk q72m, moderate risk q63m, low risk yearly ) Urine drug screen today- No Was the NCCSR reviewed- yes  If yes were their any concerning findings? - no   Overdose risk: 1 Pain contract signed on:08/04/19     Outpatient Encounter Medications as of 05/01/2020  Medication Sig  . albuterol (PROVENTIL HFA;VENTOLIN HFA) 108 (90 Base) MCG/ACT inhaler Inhale 2 puffs into the lungs every 6 (six) hours as needed for wheezing or shortness of breath.  . gabapentin (NEURONTIN) 400 MG capsule TAKE TWO CAPSULES THREE TIMES DAILY  . meloxicam (MOBIC) 7.5 MG tablet Take 1 tablet (7.5 mg total) by mouth 2 (two) times daily.  11m rOPINIRole (REQUIP) 2 MG tablet TAKE 1 TO 2 TABLETS AT BEDTIME  . SUMAtriptan (IMITREX) 100 MG tablet Take 100 mg by mouth every 2 (two) hours as needed for migraine. May repeat in 2 hours if headache persists or  recurs.  . topiramate (TOPAMAX) 100 MG tablet Take 1 tablet (100 mg total) by mouth 2 (two) times daily.  Marland Kitchen venlafaxine XR (EFFEXOR-XR) 75 MG 24 hr capsule Take 2 capsules (150 mg total) by mouth daily with breakfast.     History reviewed. No pertinent surgical history.  Family History  Problem Relation Age of Onset  . Diabetes Mother     New complaints: none  Social history: Lives with husband and children    Review of Systems  Constitutional: Negative for diaphoresis.  Eyes: Negative for pain.  Respiratory: Negative for shortness of breath.   Cardiovascular: Negative for chest pain, palpitations and leg swelling.  Gastrointestinal: Negative for abdominal  pain.  Endocrine: Negative for polydipsia.  Skin: Negative for rash.  Neurological: Negative for dizziness, weakness and headaches.  Hematological: Does not bruise/bleed easily.  All other systems reviewed and are negative.      Objective:   Physical Exam Vitals and nursing note reviewed.  Constitutional:      General: She is not in acute distress.    Appearance: Normal appearance. She is well-developed and well-nourished.  HENT:     Head: Normocephalic.     Nose: Nose normal.     Mouth/Throat:     Mouth: Oropharynx is clear and moist.  Eyes:     Extraocular Movements: EOM normal.     Pupils: Pupils are equal, round, and reactive to light.  Neck:     Vascular: No carotid bruit or JVD.  Cardiovascular:     Rate and Rhythm: Normal rate and regular rhythm.     Pulses: Intact distal pulses.     Heart sounds: Normal heart sounds.  Pulmonary:     Effort: Pulmonary effort is normal. No respiratory distress.     Breath sounds: Normal breath sounds. No wheezing or rales.  Chest:     Chest wall: No tenderness.  Abdominal:     General: Bowel sounds are normal. There is no distension or abdominal bruit. Aorta is normal.     Palpations: Abdomen is soft. There is no hepatomegaly, splenomegaly, mass or pulsatile mass.     Tenderness: There is no abdominal tenderness.  Musculoskeletal:        General: No edema. Normal range of motion.     Cervical back: Normal range of motion and neck supple.     Comments: Rises slowly for sitting to standing  Lymphadenopathy:     Cervical: No cervical adenopathy.  Skin:    General: Skin is warm and dry.  Neurological:     Mental Status: She is alert and oriented to person, place, and time.     Deep Tendon Reflexes: Reflexes are normal and symmetric.  Psychiatric:        Mood and Affect: Affect is blunt.        Behavior: Behavior normal.        Thought Content: Thought content normal.        Judgment: Judgment normal.    BP 135/85   Pulse 73    Temp 97.9 F (36.6 C) (Temporal)   Resp 20   Ht 5\' 8"  (1.727 m)   Wt 196 lb (88.9 kg)   SpO2 100%   BMI 29.80 kg/m         Assessment & Plan:  SHANEQUA WHITENIGHT comes in today with chief complaint of Medical Management of Chronic Issues   Diagnosis and orders addressed:  1. Simple chronic bronchitis (HCC)  2. Other migraine with  status migrainosus, not intractable Continue topamax Avoid caffeine  3. GAD (generalized anxiety disorder) Stress management Continue effexor  4. Recurrent major depressive disorder, in partial remission (HCC) Continue effexor  5. PTSD (post-traumatic stress disorder)  6. Restless legs Changed mirapex - patient will let me know if helps - pramipexole (MIRAPEX) 1 MG tablet; Take 1 tablet (1 mg total) by mouth 3 (three) times daily.  Dispense: 90 tablet; Refill: 2  7. Benign joint hypermobility syndrome Moist heat rest - HYDROcodone-acetaminophen (NORCO) 10-325 MG tablet; Take 1 tablet by mouth every 8 (eight) hours as needed.  Dispense: 90 tablet; Refill: 0 - HYDROcodone-acetaminophen (NORCO) 10-325 MG tablet; Take 1 tablet by mouth every 8 (eight) hours as needed.  Dispense: 90 tablet; Refill: 0 - HYDROcodone-acetaminophen (NORCO) 10-325 MG tablet; Take 1 tablet by mouth every 8 (eight) hours as needed.  Dispense: 90 tablet; Refill: 0   Labs pending Health Maintenance reviewed Diet and exercise encouraged  Follow up plan: 3 months   Mary-Margaret Daphine Deutscher, FNP

## 2020-07-25 ENCOUNTER — Ambulatory Visit (INDEPENDENT_AMBULATORY_CARE_PROVIDER_SITE_OTHER): Payer: BC Managed Care – PPO | Admitting: Nurse Practitioner

## 2020-07-25 ENCOUNTER — Other Ambulatory Visit: Payer: Self-pay

## 2020-07-25 ENCOUNTER — Other Ambulatory Visit (HOSPITAL_COMMUNITY)
Admission: RE | Admit: 2020-07-25 | Discharge: 2020-07-25 | Disposition: A | Discharge status: Home / Self Care | Payer: BC Managed Care – PPO | Source: Ambulatory Visit | Attending: Nurse Practitioner | Admitting: Nurse Practitioner

## 2020-07-25 ENCOUNTER — Encounter: Payer: Self-pay | Admitting: Nurse Practitioner

## 2020-07-25 VITALS — BP 129/86 | HR 65 | Temp 98.4°F | Resp 20 | Ht 68.0 in | Wt 197.0 lb

## 2020-07-25 DIAGNOSIS — J410 Simple chronic bronchitis: Secondary | ICD-10-CM | POA: Diagnosis not present

## 2020-07-25 DIAGNOSIS — F411 Generalized anxiety disorder: Secondary | ICD-10-CM | POA: Diagnosis not present

## 2020-07-25 DIAGNOSIS — R87610 Atypical squamous cells of undetermined significance on cytologic smear of cervix (ASC-US): Secondary | ICD-10-CM | POA: Diagnosis not present

## 2020-07-25 DIAGNOSIS — G2581 Restless legs syndrome: Secondary | ICD-10-CM

## 2020-07-25 DIAGNOSIS — Z0001 Encounter for general adult medical examination with abnormal findings: Secondary | ICD-10-CM

## 2020-07-25 DIAGNOSIS — F4310 Post-traumatic stress disorder, unspecified: Secondary | ICD-10-CM

## 2020-07-25 DIAGNOSIS — Z01419 Encounter for gynecological examination (general) (routine) without abnormal findings: Secondary | ICD-10-CM | POA: Diagnosis not present

## 2020-07-25 DIAGNOSIS — G43801 Other migraine, not intractable, with status migrainosus: Secondary | ICD-10-CM

## 2020-07-25 DIAGNOSIS — M357 Hypermobility syndrome: Secondary | ICD-10-CM

## 2020-07-25 DIAGNOSIS — Z113 Encounter for screening for infections with a predominantly sexual mode of transmission: Secondary | ICD-10-CM | POA: Diagnosis not present

## 2020-07-25 DIAGNOSIS — F3341 Major depressive disorder, recurrent, in partial remission: Secondary | ICD-10-CM

## 2020-07-25 DIAGNOSIS — R0789 Other chest pain: Secondary | ICD-10-CM | POA: Diagnosis not present

## 2020-07-25 DIAGNOSIS — Z0000 Encounter for general adult medical examination without abnormal findings: Secondary | ICD-10-CM

## 2020-07-25 MED ORDER — HYDROCODONE-ACETAMINOPHEN 10-325 MG PO TABS: 1.0000 | ORAL_TABLET | Freq: Three times a day (TID) | ORAL | 0 refills | Status: DC | PRN

## 2020-07-25 MED ORDER — HYDROCODONE-ACETAMINOPHEN 10-325 MG PO TABS: 1.0000 | ORAL_TABLET | Freq: Three times a day (TID) | ORAL | 0 refills | Status: AC | PRN

## 2020-07-25 MED ORDER — VENLAFAXINE HCL ER 75 MG PO CP24: 150.0000 mg | ORAL_CAPSULE | Freq: Every day | ORAL | 1 refills | Status: DC

## 2020-07-25 MED ORDER — TOPIRAMATE 100 MG PO TABS: 100.0000 mg | ORAL_TABLET | Freq: Two times a day (BID) | ORAL | 1 refills | Status: DC

## 2020-07-25 MED ORDER — GABAPENTIN 400 MG PO CAPS: ORAL_CAPSULE | ORAL | 5 refills | Status: DC

## 2020-07-25 MED ORDER — PRAMIPEXOLE DIHYDROCHLORIDE 1 MG PO TABS
1.0000 mg | ORAL_TABLET | Freq: Three times a day (TID) | ORAL | 2 refills | Status: DC
Start: 2020-07-25 — End: 2021-07-18

## 2020-07-25 NOTE — Patient Instructions (Signed)

## 2020-07-25 NOTE — Progress Notes (Signed)
Subjective:    Patient ID: Cynthia Hayden, female    DOB: 12/25/1980, 40 y.o.   MRN: [id number]   Chief Complaint: Annual Exam    HPI:  1. Annual physical exam Here for annual physical with PAP  2. Simple chronic bronchitis (HCC) Uses albuterol inhaler as needed, but has not had filled in several years according to chart.  3. GAD (generalized anxiety disorder) Has chronic anxiety and is on effexor daily. Works ok for her. GAD 7 : Generalized Anxiety Score 07/25/2020 05/01/2020 01/27/2020 10/29/2019  Nervous, Anxious, on Edge 1 1 1  0  Control/stop worrying 1 1 1  0  Worry too much - different things 1 1 1 1   Trouble relaxing 1 1 1  0  Restless 0 0 0 0  Easily annoyed or irritable 1 2 1 1   Afraid - awful might happen 0 0 0 1  Total GAD 7 Score 5 6 5 3   Anxiety Difficulty Somewhat difficult Somewhat difficult Not difficult at all Not difficult at all      4. PTSD (post-traumatic stress disorder) effexor helps. Has occasional flare ups  5. Recurrent major depressive disorder, in partial remission (HCC) Again is on effexor and is doing well. Depression screen [facility] 2/9 07/25/2020 05/01/2020 01/27/2020  Decreased Interest 2 0 0  Down, Depressed, Hopeless 2 1 1   PHQ - 2 Score 4 1 1   Altered sleeping 0 3 3  Tired, decreased energy 2 3 3   Change in appetite 2 0 0  Feeling bad or failure about yourself  0 0 0  Trouble concentrating 0 1 0  Moving slowly or fidgety/restless 0 0 0  Suicidal thoughts 0 0 0  PHQ-9 Score 8 8 7   Difficult doing work/chores Somewhat difficult Not difficult at all Not difficult at all  Some recent data might be hidden     6. Restless legs Is on mirapex daily and works well to keep legs calm at night.  7 hypermobility syndrome- Causes chronic pain. She is on hydrocodone 10/325 TID. She takes neurotin 4x a day. Says pain is tolerable.   Outpatient Encounter Medications as of 07/25/2020  Medication Sig  . albuterol (PROVENTIL HFA;VENTOLIN HFA) 108 (90  Base) MCG/ACT inhaler Inhale 2 puffs into the lungs every 6 (six) hours as needed for wheezing or shortness of breath.  . gabapentin (NEURONTIN) 400 MG capsule TAKE TWO CAPSULES THREE TIMES DAILY  . HYDROcodone-acetaminophen (NORCO) 10-325 MG tablet Take 1 tablet by mouth every 8 (eight) hours as needed.  . meloxicam (MOBIC) 7.5 MG tablet Take 1 tablet (7.5 mg total) by mouth 2 (two) times daily.  . pramipexole (MIRAPEX) 1 MG tablet Take 1 tablet (1 mg total) by mouth 3 (three) times daily.  . SUMAtriptan (IMITREX) 100 MG tablet Take 100 mg by mouth every 2 (two) hours as needed for migraine. May repeat in 2 hours if headache persists or recurs.  . topiramate (TOPAMAX) 100 MG tablet Take 1 tablet (100 mg total) by mouth 2 (two) times daily.  [facility] venlafaxine XR (EFFEXOR-XR) 75 MG 24 hr capsule Take 2 capsules (150 mg total) by mouth daily with breakfast.   No facility-administered encounter medications on file as of 07/25/2020.    History reviewed. No pertinent surgical history.  Family History  Problem Relation Age of Onset  . Diabetes Mother     New complaints: None today  Social history: Lives with her children  Controlled substance contract: 08/04/19    Review of Systems  Constitutional:  Negative for diaphoresis.  Eyes: Negative for pain.  Respiratory: Negative for shortness of breath.   Cardiovascular: Negative for chest pain, palpitations and leg swelling.  Gastrointestinal: Negative for abdominal pain.  Endocrine: Negative for polydipsia.  Skin: Negative for rash.  Neurological: Negative for dizziness, weakness and headaches.  Hematological: Does not bruise/bleed easily.  All other systems reviewed and are negative.      Objective:   Physical Exam Vitals and nursing note reviewed.  Constitutional:      General: She is not in acute distress.    Appearance: Normal appearance. She is well-developed.  HENT:     Head: Normocephalic.     Nose: Nose normal.  Eyes:      Pupils: Pupils are equal, round, and reactive to light.  Neck:     Vascular: No carotid bruit or JVD.  Cardiovascular:     Rate and Rhythm: Normal rate and regular rhythm.     Heart sounds: Normal heart sounds.  Pulmonary:     Effort: Pulmonary effort is normal. No respiratory distress.     Breath sounds: Normal breath sounds. No wheezing or rales.  Chest:     Chest wall: No tenderness.  Abdominal:     General: Bowel sounds are normal. There is no distension or abdominal bruit.     Palpations: Abdomen is soft. There is no hepatomegaly, splenomegaly, mass or pulsatile mass.     Tenderness: There is no abdominal tenderness.  Musculoskeletal:        General: Normal range of motion.     Cervical back: Normal range of motion and neck supple.  Lymphadenopathy:     Cervical: No cervical adenopathy.  Skin:    General: Skin is warm and dry.  Neurological:     Mental Status: She is alert and oriented to person, place, and time.     Deep Tendon Reflexes: Reflexes are normal and symmetric.  Psychiatric:        Behavior: Behavior normal.        Thought Content: Thought content normal.        Judgment: Judgment normal.     BP 129/86   Pulse 65   Temp 98.4 F (36.9 C) (Temporal)   Resp 20   Ht 5\' 8"  (1.727 m)   Wt 197 lb (89.4 kg)   SpO2 100%   BMI 29.95 kg/m   EKG- , FNP      Assessment & Plan:  Cynthia Hayden comes in today with chief complaint of Annual Exam   Diagnosis and orders addressed:  1. Annual physical exam  2. Simple chronic bronchitis (HCC) Smoking cessation encouraged  3. GAD (generalized anxiety disorder) Stress management  4. PTSD (post-traumatic stress disorder) Stress management  5. Recurrent major depressive disorder, in partial remission (HCC) - venlafaxine XR (EFFEXOR-XR) 75 MG 24 hr capsule; Take 2 capsules (150 mg total) by mouth daily with breakfast.  Dispense: 180 capsule; Refill: 1  6. Restless legs Keep leg swarm  at night - pramipexole (MIRAPEX) 1 MG tablet; Take 1 tablet (1 mg total) by mouth 3 (three) times daily.  Dispense: 90 tablet; Refill: 2  7. Benign joint hypermobility syndrome - HYDROcodone-acetaminophen (NORCO) 10-325 MG tablet; Take 1 tablet by mouth every 8 (eight) hours as needed.  Dispense: 90 tablet; Refill: 0 - HYDROcodone-acetaminophen (NORCO) 10-325 MG tablet; Take 1 tablet by mouth every 8 (eight) hours as needed.  Dispense: 90 tablet; Refill: 0 - HYDROcodone-acetaminophen (NORCO) 10-325 MG tablet; Take 1  tablet by mouth every 8 (eight) hours as needed.  Dispense: 90 tablet; Refill: 0 - gabapentin (NEURONTIN) 400 MG capsule; TAKE TWO CAPSULES THREE TIMES DAILY  Dispense: 120 capsule; Refill: 5  8. Other migraine with status migrainosus, not intractable Avoid caffeine - topiramate (TOPAMAX) 100 MG tablet; Take 1 tablet (100 mg total) by mouth 2 (two) times daily.  Dispense: 180 tablet; Refill: 1  9. Other chest pain - EKG 12-Lead Keep diary of pain Try omeprazole OTC and see if helps   Labs pending Health Maintenance reviewed Diet and exercise encouraged  Follow up plan: 3 months   Mary-Margaret Daphine Deutscher, FNP

## 2020-07-25 NOTE — Addendum Note (Signed)
Addended by: Bennie Pierini on: 07/25/2020 04:40 PM   Modules accepted: Orders

## 2020-07-31 LAB — CYTOLOGY - PAP
Chlamydia: NEGATIVE
Comment: NEGATIVE
Comment: NEGATIVE
Comment: NEGATIVE
Comment: NORMAL
Diagnosis: UNDETERMINED — AB
High risk HPV: NEGATIVE
Neisseria Gonorrhea: NEGATIVE
Trichomonas: NEGATIVE

## 2020-10-24 ENCOUNTER — Encounter: Payer: Self-pay | Admitting: Nurse Practitioner

## 2020-10-24 ENCOUNTER — Ambulatory Visit: Payer: BC Managed Care – PPO | Admitting: Nurse Practitioner

## 2020-10-24 ENCOUNTER — Other Ambulatory Visit: Payer: Self-pay

## 2020-10-24 VITALS — BP 127/93 | HR 72 | Temp 97.7°F | Resp 20 | Ht 68.0 in | Wt 203.0 lb

## 2020-10-24 DIAGNOSIS — F4310 Post-traumatic stress disorder, unspecified: Secondary | ICD-10-CM | POA: Diagnosis not present

## 2020-10-24 DIAGNOSIS — J410 Simple chronic bronchitis: Secondary | ICD-10-CM | POA: Diagnosis not present

## 2020-10-24 DIAGNOSIS — F411 Generalized anxiety disorder: Secondary | ICD-10-CM

## 2020-10-24 DIAGNOSIS — F3341 Major depressive disorder, recurrent, in partial remission: Secondary | ICD-10-CM

## 2020-10-24 DIAGNOSIS — G2581 Restless legs syndrome: Secondary | ICD-10-CM

## 2020-10-24 DIAGNOSIS — M357 Hypermobility syndrome: Secondary | ICD-10-CM

## 2020-10-24 MED ORDER — HYDROCODONE-ACETAMINOPHEN 10-325 MG PO TABS: 1.0000 | ORAL_TABLET | Freq: Three times a day (TID) | ORAL | 0 refills | Status: AC | PRN

## 2020-10-24 NOTE — Patient Instructions (Signed)
Restless Legs Syndrome Restless legs syndrome is a condition that causes uncomfortable feelings or sensations in the legs, especially while sitting or lying down. The sensations usually cause an overwhelming urge to move the legs. The arms can alsosometimes be affected. The condition can range from mild to severe. The symptoms often interfere witha person's ability to sleep. What are the causes? The cause of this condition is not known. What increases the risk? The following factors may make you more likely to develop this condition: Being older than 50. Pregnancy. Being a woman. In general, the condition is more common in women than in men. A family history of the condition. Having iron deficiency. Overuse of caffeine, nicotine, or alcohol. Certain medical conditions, such as kidney disease, Parkinson's disease, or nerve damage. Certain medicines, such as those for high blood pressure, nausea, colds, allergies, depression, and some heart conditions. What are the signs or symptoms? The main symptom of this condition is uncomfortable sensations in the legs, such as: Pulling. Tingling. Prickling. Throbbing. Crawling. Burning. Usually, the sensations: Affect both sides of the body. Are worse when you sit or lie down. Are worse at night. These may wake you up or make it difficult to fall asleep. Make you have a strong urge to move your legs. Are temporarily relieved by moving your legs. The arms can also be affected, but this is rare. People who have this conditionoften have tiredness during the day because of their lack of sleep at night. How is this diagnosed? This condition may be diagnosed based on: Your symptoms. Blood tests. In some cases, you may be monitored in a sleep lab by a specialist (a sleep study). This can detect any disruptions in your sleep. How is this treated? This condition is treated by managing the symptoms. This may include: Lifestyle changes, such as  exercising, using relaxation techniques, and avoiding caffeine, alcohol, or tobacco. Medicines. Anti-seizure medicines may be tried first. Follow these instructions at home: General instructions Take over-the-counter and prescription medicines only as told by your health care provider. Use methods to help relieve the uncomfortable sensations, such as: Massaging your legs. Walking or stretching. Taking a cold or hot bath. Keep all follow-up visits as told by your health care provider. This is important. Lifestyle     Practice good sleep habits. For example, go to bed and get up at the same time every day. Most adults should get 7-9 hours of sleep each night. Exercise regularly. Try to get at least 30 minutes of exercise most days of the week. Practice ways of relaxing, such as yoga or meditation. Avoid caffeine and alcohol. Do not use any products that contain nicotine or tobacco, such as cigarettes and e-cigarettes. If you need help quitting, ask your health care provider. Contact a health care provider if: Your symptoms get worse or they do not improve with treatment. Summary Restless legs syndrome is a condition that causes uncomfortable feelings or sensations in the legs, especially while sitting or lying down. The symptoms often interfere with a person's ability to sleep. This condition is treated by managing the symptoms. You may need to make lifestyle changes or take medicines. This information is not intended to replace advice given to you by your health care provider. Make sure you discuss any questions you have with your healthcare provider. Document Revised: 04/02/2019 Document Reviewed: 03/03/2017 Elsevier Patient Education  2022 [employer] Inc.  

## 2020-10-24 NOTE — Progress Notes (Signed)
Subjective:    Patient ID: Cynthia Hayden, female    DOB: Jun 04, 1980, 40 y.o.   MRN: [id number]   Chief Complaint: Medical Management of Chronic Issues    HPI:  1. Simple chronic bronchitis (HCC) Doing well. Uses albuterol 1-2 x a month. She denies cough. She does smoke more then a pack a day.  2. GAD (generalized anxiety disorder) Stays stressed. The effexor helps. GAD 7 : Generalized Anxiety Score 10/24/2020 07/25/2020 05/01/2020 01/27/2020  Nervous, Anxious, on Edge 1 1 1 1   Control/stop worrying 1 1 1 1   Worry too much - different things 1 1 1 1   Trouble relaxing 1 1 1 1   Restless 0 0 0 0  Easily annoyed or irritable 0 1 2 1   Afraid - awful might happen 0 0 0 0  Total GAD 7 Score 4 5 6 5   Anxiety Difficulty Somewhat difficult Somewhat difficult Somewhat difficult Not difficult at all      3. PTSD (post-traumatic stress disorder) Is doing well. No panic attacks. Effexor helps  4. Recurrent major depressive disorder, in partial remission (HCC) Again effexor helps Depression screen [facility] 2/9 10/24/2020 07/25/2020 05/01/2020  Decreased Interest 2 2 0  Down, Depressed, Hopeless 1 2 1   PHQ - 2 Score 3 4 1   Altered sleeping 2 0 3  Tired, decreased energy 2 2 3   Change in appetite 1 2 0  Feeling bad or failure about yourself  1 0 0  Trouble concentrating 1 0 1  Moving slowly or fidgety/restless 0 0 0  Suicidal thoughts 0 0 0  PHQ-9 Score 10 8 8   Difficult doing work/chores Not difficult at all Somewhat difficult Not difficult at all  Some recent data might be hidden      5. Restless legs Is on mirapex nightly and is doing well.  6. Benign jointh hypermobility syndrome Pain assessment: Cause of pain- joint hypermobility Pain location- in hands and knees lately- will migrate Pain on scale of 1-10- 7/10 Frequency- daily What increases pain-to much activity What makes pain Better-rest helpsno Effects on ADL - ne Any change in general medical condition-none  Current  opioids rx- norco 10/325 TID # meds rx- 90 Effectiveness of current meds-helps but always has some pain Adverse reactions from pain meds-none Morphine equivalent-  Pill count performed-no Last drug screen - 08/02/19 ( high risk q56m, moderate risk q29m, low risk yearly ) Urine drug screen today- Yes Was the [facility] reviewed- yes  If yes were their any concerning findings? - no   Overdose risk: 1   Pain contract signed on:   Outpatient Encounter Medications as of 10/24/2020  Medication Sig   albuterol (PROVENTIL HFA;VENTOLIN HFA) 108 (90 Base) MCG/ACT inhaler Inhale 2 puffs into the lungs every 6 (six) hours as needed for wheezing or shortness of breath.   gabapentin (NEURONTIN) 400 MG capsule TAKE TWO CAPSULES THREE TIMES DAILY   HYDROcodone-acetaminophen (NORCO) 10-325 MG tablet Take 1 tablet by mouth every 8 (eight) hours as needed.   meloxicam (MOBIC) 7.5 MG tablet Take 1 tablet (7.5 mg total) by mouth 2 (two) times daily.   pramipexole (MIRAPEX) 1 MG tablet Take 1 tablet (1 mg total) by mouth 3 (three) times daily.   SUMAtriptan (IMITREX) 100 MG tablet Take 100 mg by mouth every 2 (two) hours as needed for migraine. May repeat in 2 hours if headache persists or recurs.   topiramate (TOPAMAX) 100 MG tablet Take 1 tablet (100 mg total) by  mouth 2 (two) times daily.   venlafaxine XR (EFFEXOR-XR) 75 MG 24 hr capsule Take 2 capsules (150 mg total) by mouth daily with breakfast.   No facility-administered encounter medications on file as of 10/24/2020.    History reviewed. No pertinent surgical history.  Family History  Problem Relation Age of Onset   Diabetes Mother     New complaints: Patient had 5 teeth pulled on top today so now she has no teeth on top  Social history: Lives with her husband and children       Review of Systems  Constitutional:  Negative for diaphoresis.  Eyes:  Negative for pain.  Respiratory:  Negative for shortness of breath.    Cardiovascular:  Negative for chest pain, palpitations and leg swelling.  Gastrointestinal:  Negative for abdominal pain.  Endocrine: Negative for polydipsia.  Skin:  Negative for rash.  Neurological:  Negative for dizziness, weakness and headaches.  Hematological:  Does not bruise/bleed easily.  All other systems reviewed and are negative.     Objective:   Physical Exam Vitals and nursing note reviewed.  Constitutional:      General: She is not in acute distress.    Appearance: Normal appearance. She is well-developed.  HENT:     Head: Normocephalic.     Right Ear: Tympanic membrane normal.     Left Ear: Tympanic membrane normal.     Nose: Nose normal.     Mouth/Throat:     Mouth: Mucous membranes are moist.     Comments: Top teeth Eyes:     Pupils: Pupils are equal, round, and reactive to light.  Neck:     Vascular: No carotid bruit or JVD.  Cardiovascular:     Rate and Rhythm: Normal rate and regular rhythm.     Heart sounds: Normal heart sounds.  Pulmonary:     Effort: Pulmonary effort is normal. No respiratory distress.     Breath sounds: Normal breath sounds. No wheezing or rales.  Chest:     Chest wall: No tenderness.  Abdominal:     General: Bowel sounds are normal. There is no distension or abdominal bruit.     Palpations: Abdomen is soft. There is no hepatomegaly, splenomegaly, mass or pulsatile mass.     Tenderness: There is no abdominal tenderness.  Musculoskeletal:        General: Normal range of motion.     Cervical back: Normal range of motion and neck supple.  Lymphadenopathy:     Cervical: No cervical adenopathy.  Skin:    General: Skin is warm and dry.  Neurological:     Mental Status: She is alert and oriented to person, place, and time.     Deep Tendon Reflexes: Reflexes are normal and symmetric.  Psychiatric:        Behavior: Behavior normal.        Thought Content: Thought content normal.        Judgment: Judgment normal.   BP (!) 127/93    Pulse 72   Temp 97.7 F (36.5 C) (Temporal)   Resp 20   Ht 5\' 8"  (1.727 m)   Wt 203 lb (92.1 kg)   SpO2 100%   BMI 30.87 kg/m         Assessment & Plan:   in today with chief complaint of Medical Management of Chronic Issues   1. Simple chronic bronchitis (HCC) Smoking cessation encouraged  2. GAD (generalized anxiety disorder) Stress management  3.  PTSD (post-traumatic stress disorder)  4. Recurrent major depressive disorder, in partial remission (HCC)  5. Restless legs Keep legs warm at night  6. Benign joint hypermobility syndrome Follow up in 3 months - HYDROcodone-acetaminophen (NORCO) 10-325 MG tablet; Take 1 tablet by mouth every 8 (eight) hours as needed.  Dispense: 90 tablet; Refill: 0 - HYDROcodone-acetaminophen (NORCO) 10-325 MG tablet; Take 1 tablet by mouth every 8 (eight) hours as needed.  Dispense: 90 tablet; Refill: 0 - HYDROcodone-acetaminophen (NORCO) 10-325 MG tablet; Take 1 tablet by mouth every 8 (eight) hours as needed.  Dispense: 90 tablet; Refill: 0 - ToxAS[employer] Select 13 (MW), Urine    The above assessment and management plan was discussed with the patient. The patient verbalized understanding of and has agreed to the management plan. Patient is aware to call the clinic if symptoms persist or worsen. Patient is aware when to return to the clinic for a follow-up visit. Patient educated on when it is appropriate to go to the emergency department.   Mary-Margaret Daphine Deutscher, FNP

## 2020-10-24 NOTE — Addendum Note (Signed)
Addended by: Cleda Daub on: 10/24/2020 02:54 PM   Modules accepted: Orders

## 2020-11-02 LAB — DRUG SCREEN 10 W/CONF, SERUM
Amphetamines, IA: NEGATIVE ng/mL
Barbiturates, IA: NEGATIVE ug/mL
Benzodiazepines, IA: NEGATIVE ng/mL
Cocaine & Metabolite, IA: NEGATIVE ng/mL
Methadone, IA: NEGATIVE ng/mL
Opiates, IA: NEGATIVE ng/mL
Oxycodones, IA: NEGATIVE ng/mL
Phencyclidine, IA: NEGATIVE ng/mL
Propoxyphene, IA: NEGATIVE ng/mL
THC(Marijuana) Metabolite, IA: NEGATIVE ng/mL

## 2020-11-02 LAB — OXYCODONES,MS,WB/SP RFX
Oxycocone: NEGATIVE ng/mL
Oxycodones Confirmation: NEGATIVE
Oxymorphone: NEGATIVE ng/mL

## 2020-11-06 NOTE — Telephone Encounter (Signed)
I am saying that if you are taking pain meds daily, it should have ben in your system. This is an office policy with regards tio drug screen results. You can try to find another provider for pain meds, but we can no longer prescribe them here.

## 2020-11-06 NOTE — Telephone Encounter (Signed)
Part of pain contract in office is routine drug screens. Not only can you not have other controlled or illegal substances in your system but you must have prescribed meds in your system. The blood test is more sensative to meds then urine screen.

## 2020-11-07 NOTE — Telephone Encounter (Signed)
According to the blood drug screen that we do here it is 24 hours. If you take medsas prescribed you woldhavve taken 3 the day prior and they should hve still been in your system.

## 2020-11-28 ENCOUNTER — Other Ambulatory Visit: Payer: Self-pay | Admitting: Nurse Practitioner

## 2020-11-28 DIAGNOSIS — Z23 Encounter for immunization: Secondary | ICD-10-CM | POA: Diagnosis not present

## 2020-11-28 DIAGNOSIS — M357 Hypermobility syndrome: Secondary | ICD-10-CM

## 2020-11-28 NOTE — Telephone Encounter (Signed)
Patient can no longer get pain medication from our office

## 2020-12-20 ENCOUNTER — Other Ambulatory Visit: Payer: Self-pay

## 2020-12-20 MED ORDER — SUMATRIPTAN SUCCINATE 100 MG PO TABS: 100.0000 mg | ORAL_TABLET | ORAL | 0 refills | Status: DC | PRN

## 2020-12-21 ENCOUNTER — Other Ambulatory Visit: Payer: Self-pay | Admitting: Nurse Practitioner

## 2020-12-21 DIAGNOSIS — M357 Hypermobility syndrome: Secondary | ICD-10-CM

## 2020-12-21 NOTE — Telephone Encounter (Signed)
Don't see referral please advise if we where going to do one

## 2020-12-21 NOTE — Progress Notes (Signed)
Refferal made to paon management

## 2020-12-29 ENCOUNTER — Telehealth: Payer: Self-pay

## 2020-12-29 DIAGNOSIS — M357 Hypermobility syndrome: Secondary | ICD-10-CM

## 2020-12-29 DIAGNOSIS — M25569 Pain in unspecified knee: Secondary | ICD-10-CM

## 2020-12-29 NOTE — Telephone Encounter (Signed)
Received notification that pain management declined to see patient. New referral made and spoke with referrals. They will send to another pain management office.

## 2021-01-02 ENCOUNTER — Other Ambulatory Visit: Payer: Self-pay | Admitting: Nurse Practitioner

## 2021-01-02 DIAGNOSIS — M357 Hypermobility syndrome: Secondary | ICD-10-CM

## 2021-01-24 ENCOUNTER — Ambulatory Visit: Payer: BC Managed Care – PPO | Admitting: Nurse Practitioner

## 2021-01-25 ENCOUNTER — Encounter: Payer: Self-pay | Admitting: Nurse Practitioner

## 2021-02-15 ENCOUNTER — Ambulatory Visit: Payer: BC Managed Care – PPO | Admitting: Family Medicine

## 2021-02-16 ENCOUNTER — Ambulatory Visit: Payer: BC Managed Care – PPO | Admitting: Family Medicine

## 2021-03-13 ENCOUNTER — Other Ambulatory Visit: Payer: Self-pay | Admitting: Nurse Practitioner

## 2021-04-21 ENCOUNTER — Other Ambulatory Visit: Payer: Self-pay | Admitting: Nurse Practitioner

## 2021-04-21 DIAGNOSIS — G43801 Other migraine, not intractable, with status migrainosus: Secondary | ICD-10-CM

## 2021-05-23 ENCOUNTER — Other Ambulatory Visit: Payer: Self-pay | Admitting: Nurse Practitioner

## 2021-05-23 DIAGNOSIS — G43801 Other migraine, not intractable, with status migrainosus: Secondary | ICD-10-CM

## 2021-05-24 ENCOUNTER — Ambulatory Visit: Payer: BC Managed Care – PPO | Admitting: Nurse Practitioner

## 2021-05-28 ENCOUNTER — Ambulatory Visit: Payer: BC Managed Care – PPO | Admitting: Nurse Practitioner

## 2021-05-30 ENCOUNTER — Encounter: Payer: Self-pay | Admitting: Nurse Practitioner

## 2021-06-11 ENCOUNTER — Ambulatory Visit: Payer: BC Managed Care – PPO | Admitting: Family Medicine

## 2021-06-11 ENCOUNTER — Encounter: Payer: Self-pay | Admitting: Family Medicine

## 2021-06-11 VITALS — BP 125/82 | HR 78 | Temp 98.6°F | Ht 68.0 in | Wt 225.1 lb

## 2021-06-11 DIAGNOSIS — R0683 Snoring: Secondary | ICD-10-CM

## 2021-06-11 DIAGNOSIS — E6609 Other obesity due to excess calories: Secondary | ICD-10-CM | POA: Diagnosis not present

## 2021-06-11 DIAGNOSIS — G4719 Other hypersomnia: Secondary | ICD-10-CM

## 2021-06-11 DIAGNOSIS — F3341 Major depressive disorder, recurrent, in partial remission: Secondary | ICD-10-CM

## 2021-06-11 DIAGNOSIS — Z833 Family history of diabetes mellitus: Secondary | ICD-10-CM

## 2021-06-11 DIAGNOSIS — Z6834 Body mass index (BMI) 34.0-34.9, adult: Secondary | ICD-10-CM

## 2021-06-11 DIAGNOSIS — F411 Generalized anxiety disorder: Secondary | ICD-10-CM | POA: Diagnosis not present

## 2021-06-11 DIAGNOSIS — R5383 Other fatigue: Secondary | ICD-10-CM | POA: Diagnosis not present

## 2021-06-11 LAB — BAYER DCA HB A1C WAIVED: HB A1C (BAYER DCA - WAIVED): 5 % (ref 4.8–5.6)

## 2021-06-11 MED ORDER — VENLAFAXINE HCL ER 75 MG PO CP24: 75.0000 mg | ORAL_CAPSULE | Freq: Every day | ORAL | 1 refills | Status: DC

## 2021-06-11 MED ORDER — FLUOXETINE HCL 20 MG PO CAPS: 20.0000 mg | ORAL_CAPSULE | Freq: Every day | ORAL | 1 refills | Status: DC

## 2021-06-11 NOTE — Progress Notes (Signed)
? ?Acute Office Visit ? ?Subjective:  ? ? Patient ID: Cynthia Hayden, female    DOB: 17-Apr-1980, 41 y.o.   MRN: [id number] ? ?Chief Complaint  ?Patient presents with  ? Anxiety  ? ? ?HPI ?Patient is in today for anxiety and depression. She has been taking care of a neighbor that is currently on hospice. She has been on effexor 150 daily for years. She doesn't feel like this works well for her. She has taken buspar and abilify but reports she did not do well on these either.  ? ?She is also concerned about how tired she is during the day. She will fall asleep if she sits down for a few minutes. She reports loud snoring. Her husband has told her many times that she stops breathing in her sleep. She does not feel rested when she wakes in the morning. She has never had a sleep study.  ? ?She would also like to be checked for diabetes today. She reports a family history of T2DM.  ? ? ?  06/11/2021  ? 10:16 AM 10/24/2020  ?  2:27 PM 07/25/2020  ?  3:26 PM 05/01/2020  ? 11:32 AM  ?GAD 7 : Generalized Anxiety Score  ?Nervous, Anxious, on Edge '2 1 1 1  ' ?Control/stop worrying '2 1 1 1  ' ?Worry too much - different things '2 1 1 1  ' ?Trouble relaxing '2 1 1 1  ' ?Restless 2 0 0 0  ?Easily annoyed or irritable 2 0 1 2  ?Afraid - awful might happen 2 0 0 0  ?Total GAD 7 Score '14 4 5 6  ' ?Anxiety Difficulty Somewhat difficult Somewhat difficult Somewhat difficult Somewhat difficult  ? ? ? ?  06/11/2021  ? 10:15 AM 10/24/2020  ?  2:27 PM 07/25/2020  ?  3:26 PM  ?Depression screen PHQ 2/9  ?Decreased Interest '2 2 2  ' ?Down, Depressed, Hopeless '2 1 2  ' ?PHQ - 2 Score '4 3 4  ' ?Altered sleeping 2 2 0  ?Tired, decreased energy '2 2 2  ' ?Change in appetite '2 1 2  ' ?Feeling bad or failure about yourself  1 1 0  ?Trouble concentrating 1 1 0  ?Moving slowly or fidgety/restless 0 0 0  ?Suicidal thoughts 0 0 0  ?PHQ-9 Score '12 10 8  ' ?Difficult doing work/chores Somewhat difficult Not difficult at all Somewhat difficult  ? ? ? ?Past Medical History:  ?Diagnosis  Date  ? Anxiety   ? Arthritis   ? Depression   ? Hypermobility syndrome   ? Migraine   ? RLS (restless legs syndrome)   ? ? ?No past surgical history on file. ? ?Family History  ?Problem Relation Age of Onset  ? Diabetes Mother   ? ? ?Social History  ? ?Socioeconomic History  ? Marital status: Married  ?  Spouse name: Not on file  ? Number of children: Not on file  ? Years of education: Not on file  ? Highest education level: Not on file  ?Occupational History  ? Not on file  ?Tobacco Use  ? Smoking status: Every Day  ?  Packs/day: 1.00  ?  Types: Cigarettes  ? Smokeless tobacco: Never  ?Vaping Use  ? Vaping Use: Never used  ?Substance and Sexual Activity  ? Alcohol use: No  ? Drug use: No  ? Sexual activity: Not on file  ?Other Topics Concern  ? Not on file  ?Social History Narrative  ? Not on file  ? ?Social Determinants  of Health  ? ?Financial Resource Strain: Not on file  ?Food Insecurity: Not on file  ?Transportation Needs: Not on file  ?Physical Activity: Not on file  ?Stress: Not on file  ?Social Connections: Not on file  ?Intimate Partner Violence: Not on file  ? ? ?Outpatient Medications Prior to Visit  ?Medication Sig Dispense Refill  ? gabapentin (NEURONTIN) 400 MG capsule TAKE TWO CAPSULES THREE TIMES DAILY 120 capsule 5  ? meloxicam (MOBIC) 7.5 MG tablet TAKE ONE TABLET BY MOUTH TWICE DAILY 180 tablet 3  ? pramipexole (MIRAPEX) 1 MG tablet Take 1 tablet (1 mg total) by mouth 3 (three) times daily. 90 tablet 2  ? SUMAtriptan (IMITREX) 100 MG tablet TAKE 1 TABLET AT ONSET OF MIGRAINE HEADACHE MAY REPEAT ONCE IN 2 HOURS (MAX OF 2 TABLETS/24 HOURS) (NEEDS TO BE SEEN BEFORE NEXT REFILL) 9 tablet 0  ? topiramate (TOPAMAX) 100 MG tablet TAKE ONE (1) TABLET BY MOUTH TWO (2) TIMES DAILY 180 tablet 0  ? venlafaxine XR (EFFEXOR-XR) 75 MG 24 hr capsule Take 2 capsules (150 mg total) by mouth daily with breakfast. 180 capsule 1  ? albuterol (PROVENTIL HFA;VENTOLIN HFA) 108 (90 Base) MCG/ACT inhaler Inhale 2 puffs  into the lungs every 6 (six) hours as needed for wheezing or shortness of breath. (Patient not taking: Reported on 06/11/2021) 1 Inhaler 0  ? ?No facility-administered medications prior to visit.  ? ? ?Allergies  ?Allergen Reactions  ? Penicillins   ? ? ?Review of Systems ?As per HPI.  ?   ?Objective:  ?  ?Physical Exam ?Vitals and nursing note reviewed.  ?Constitutional:   ?   General: She is not in acute distress. ?   Appearance: She is not ill-appearing, toxic-appearing or diaphoretic.  ?HENT:  ?   Head: Normocephalic and atraumatic.  ?   Nose: Nose normal.  ?   Mouth/Throat:  ?   Mouth: Mucous membranes are moist.  ?   Pharynx: Oropharynx is clear.  ?Eyes:  ?   Extraocular Movements: Extraocular movements intact.  ?   Pupils: Pupils are equal, round, and reactive to light.  ?Neck:  ?   Thyroid: No thyroid mass, thyromegaly or thyroid tenderness.  ?Cardiovascular:  ?   Rate and Rhythm: Normal rate and regular rhythm.  ?   Heart sounds: Normal heart sounds. No murmur heard. ?Pulmonary:  ?   Effort: Pulmonary effort is normal. No respiratory distress.  ?   Breath sounds: Normal breath sounds.  ?Musculoskeletal:  ?   Right lower leg: No edema.  ?   Left lower leg: No edema.  ?Skin: ?   General: Skin is warm and dry.  ?Neurological:  ?   General: No focal deficit present.  ?   Mental Status: She is alert and oriented to person, place, and time.  ?Psychiatric:     ?   Mood and Affect: Mood normal.     ?   Behavior: Behavior normal.  ? ? ?BP 125/82   Pulse 78   Temp 98.6 ?F (37 ?C) (Temporal)   Ht '5\' 8"'  (1.727 m)   Wt 225 lb 2 oz (102.1 kg)   BMI 34.23 kg/m?  ?Wt Readings from Last 3 Encounters:  ?06/11/21 225 lb 2 oz (102.1 kg)  ?10/24/20 203 lb (92.1 kg)  ?07/25/20 197 lb (89.4 kg)  ? ? ?Health Maintenance Due  ?Topic Date Due  ? TETANUS/TDAP  Never done  ? COVID-19 Vaccine (3 - Booster for Pfizer series) 12/10/2019  ? ? ?  There are no preventive care reminders to display for this patient. ? ? ?No results found  for: TSH ?Lab Results  ?Component Value Date  ? WBC 10.5 08/02/2019  ? HGB 15.7 08/02/2019  ? HCT 46.9 (H) 08/02/2019  ? MCV 98 (H) 08/02/2019  ? PLT 240 08/02/2019  ? ?Lab Results  ?Component Value Date  ? NA 138 08/02/2019  ? K 4.4 08/02/2019  ? CO2 18 (L) 08/02/2019  ? GLUCOSE 83 08/02/2019  ? BUN 17 08/02/2019  ? CREATININE 0.81 08/02/2019  ? BILITOT 0.2 08/02/2019  ? ALKPHOS 69 08/02/2019  ? AST 9 08/02/2019  ? ALT 10 08/02/2019  ? PROT 7.2 08/02/2019  ? ALBUMIN 4.2 08/02/2019  ? CALCIUM 9.0 08/02/2019  ? ?No results found for: CHOL ?No results found for: HDL ?No results found for: [place] ?No results found for: TRIG ?No results found for: CHOLHDL ?No results found for: HGBA1C ? ?   ?Assessment & Plan:  ? ?Kierria was seen today for anxiety. ? ?Diagnoses and all orders for this visit: ? ?Recurrent major depressive disorder, in partial remission ([place]) ?Generalized anxiety disorder ?Not well controlled. Denies SI. Start prozac and decrease effexor to 75 mg today to start transitioning from effexor to prozac.  ?-     FLUoxetine (PROZAC) 20 MG capsule; Take 1 capsule (20 mg total) by mouth daily. ?-     venlafaxine XR (EFFEXOR XR) 75 MG 24 hr capsule; Take 1 capsule (75 mg total) by mouth daily with breakfast. ? ?Class 1 obesity due to excess calories without serious comorbidity with body mass index (BMI) of 34.0 to 34.9 in adult ?Diet and exercise. Labs pending.  ?-     CBC with Differential/Platelet ?-     CMP14+EGFR ?-     Thyroid Panel With TSH ?-     Lipid panel ?-     Bayer DCA Hb A1c Waived ? ?Excessive daytime sleepiness ?Loud snoring ?? Sleep apnea. Referral to sleep med today.  ?-     Ambulatory referral to Sleep Studies ? ?Family history of diabetes mellitus ?-     Bayer DCA Hb A1c Waived ? ? ?Patient would like to switch providers to me as PCP. She has breeched her CSA with current provider. Discussed with patient that this is fine with me with the understanding that I will not be able to prescribe  controlled substances for her. Discussed that current PCP will also have to agree to switch. ? ?Return in about 4 weeks (around 07/09/2021) for medication follow up. ? ?The patient indicates understanding of these issue

## 2021-06-12 LAB — CBC WITH DIFFERENTIAL/PLATELET
Basophils Absolute: 0.1 10*3/uL (ref 0.0–0.2)
Basos: 1 %
EOS (ABSOLUTE): 0.1 10*3/uL (ref 0.0–0.4)
Eos: 1 %
Hematocrit: 43.9 % (ref 34.0–46.6)
Hemoglobin: 14.4 g/dL (ref 11.1–15.9)
Immature Grans (Abs): 0 10*3/uL (ref 0.0–0.1)
Immature Granulocytes: 0 %
Lymphocytes Absolute: 2.3 10*3/uL (ref 0.7–3.1)
Lymphs: 24 %
MCH: 31.6 pg (ref 26.6–33.0)
MCHC: 32.8 g/dL (ref 31.5–35.7)
MCV: 97 fL (ref 79–97)
Monocytes Absolute: 0.4 10*3/uL (ref 0.1–0.9)
Monocytes: 4 %
Neutrophils Absolute: 6.8 10*3/uL (ref 1.4–7.0)
Neutrophils: 70 %
Platelets: 242 10*3/uL (ref 150–450)
RBC: 4.55 x10E6/uL (ref 3.77–5.28)
RDW: 12.5 % (ref 11.7–15.4)
WBC: 9.7 10*3/uL (ref 3.4–10.8)

## 2021-06-12 LAB — CMP14+EGFR
ALT: 14 IU/L (ref 0–32)
AST: 13 IU/L (ref 0–40)
Albumin/Globulin Ratio: 1.5 (ref 1.2–2.2)
Albumin: 4 g/dL (ref 3.8–4.8)
Alkaline Phosphatase: 77 IU/L (ref 44–121)
BUN/Creatinine Ratio: 14 (ref 9–23)
BUN: 10 mg/dL (ref 6–20)
Bilirubin Total: 0.3 mg/dL (ref 0.0–1.2)
CO2: 18 mmol/L — ABNORMAL LOW (ref 20–29)
Calcium: 8.9 mg/dL (ref 8.7–10.2)
Chloride: 107 mmol/L — ABNORMAL HIGH (ref 96–106)
Creatinine, Ser: 0.72 mg/dL (ref 0.57–1.00)
Globulin, Total: 2.6 g/dL (ref 1.5–4.5)
Glucose: 84 mg/dL (ref 70–99)
Potassium: 4.1 mmol/L (ref 3.5–5.2)
Sodium: 138 mmol/L (ref 134–144)
Total Protein: 6.6 g/dL (ref 6.0–8.5)
eGFR: 110 mL/min/{1.73_m2} (ref >59)

## 2021-06-12 LAB — THYROID PANEL WITH TSH
Free Thyroxine Index: 1.8 (ref 1.2–4.9)
T3 Uptake Ratio: 23 % — ABNORMAL LOW (ref 24–39)
T4, Total: 7.7 ug/dL (ref 4.5–12.0)
TSH: 3.9 u[IU]/mL (ref 0.450–4.500)

## 2021-06-12 LAB — LIPID PANEL
Chol/HDL Ratio: 4.6 ratio — ABNORMAL HIGH (ref 0.0–4.4)
Cholesterol, Total: 207 mg/dL — ABNORMAL HIGH (ref 100–199)
HDL: 45 mg/dL (ref >39)
LDL Chol Calc (NIH): 139 mg/dL — ABNORMAL HIGH (ref 0–99)
Triglycerides: 127 mg/dL (ref 0–149)
VLDL Cholesterol Cal: 23 mg/dL (ref 5–40)

## 2021-06-22 ENCOUNTER — Other Ambulatory Visit: Payer: Self-pay | Admitting: Family Medicine

## 2021-06-22 DIAGNOSIS — G43801 Other migraine, not intractable, with status migrainosus: Secondary | ICD-10-CM

## 2021-06-22 MED ORDER — TOPIRAMATE 100 MG PO TABS: ORAL_TABLET | ORAL | 1 refills | Status: DC

## 2021-07-09 ENCOUNTER — Other Ambulatory Visit: Payer: Self-pay | Admitting: Family Medicine

## 2021-07-09 ENCOUNTER — Ambulatory Visit: Payer: BC Managed Care – PPO | Admitting: Family Medicine

## 2021-07-09 ENCOUNTER — Ambulatory Visit: Payer: BC Managed Care – PPO | Admitting: Nurse Practitioner

## 2021-07-09 ENCOUNTER — Encounter: Payer: Self-pay | Admitting: Family Medicine

## 2021-07-09 VITALS — BP 125/70 | HR 85 | Temp 98.7°F | Ht 68.0 in | Wt 223.2 lb

## 2021-07-09 DIAGNOSIS — R040 Epistaxis: Secondary | ICD-10-CM

## 2021-07-09 DIAGNOSIS — M25562 Pain in left knee: Secondary | ICD-10-CM

## 2021-07-09 DIAGNOSIS — M357 Hypermobility syndrome: Secondary | ICD-10-CM

## 2021-07-09 DIAGNOSIS — F411 Generalized anxiety disorder: Secondary | ICD-10-CM

## 2021-07-09 DIAGNOSIS — F3341 Major depressive disorder, recurrent, in partial remission: Secondary | ICD-10-CM

## 2021-07-09 DIAGNOSIS — M25561 Pain in right knee: Secondary | ICD-10-CM | POA: Diagnosis not present

## 2021-07-09 DIAGNOSIS — J410 Simple chronic bronchitis: Secondary | ICD-10-CM

## 2021-07-09 MED ORDER — UMECLIDINIUM-VILANTEROL 62.5-25 MCG/ACT IN AEPB: 1.0000 | INHALATION_SPRAY | Freq: Every day | RESPIRATORY_TRACT | 5 refills | Status: DC

## 2021-07-09 MED ORDER — DICLOFENAC SODIUM 75 MG PO TBEC: 75.0000 mg | DELAYED_RELEASE_TABLET | Freq: Two times a day (BID) | ORAL | 0 refills | Status: DC

## 2021-07-09 MED ORDER — CITALOPRAM HYDROBROMIDE 20 MG PO TABS: 20.0000 mg | ORAL_TABLET | Freq: Every day | ORAL | 1 refills | Status: DC

## 2021-07-09 MED ORDER — PREDNISONE 10 MG (21) PO TBPK
ORAL_TABLET | ORAL | 0 refills | Status: DC
Start: 2021-07-09 — End: 2021-08-22

## 2021-07-09 NOTE — Progress Notes (Signed)
? ?Established Patient Office Visit ? ?Subjective   ?Patient ID: Cynthia Hayden, female    DOB: [birth year]  Age: 41 y.o. MRN: [id number] ? ?Chief Complaint  ?Patient presents with  ? Depression  ? ? ?HPI ?Annalise stopped taking prozac about 10 days ago. She reports that this made her feel like she was having panic attacks. She has now decreased her effexor down to 75 mg and denies side effects of this.  ? ?She has a history of COPD. She reports waking up with blood on her pillow 2 different times in the last week. Denies congestion. She denies productive cough during the day. She has a chronic cough that feels like a tickle in her throat. Denies sore throat, fever, or chest pain. She reports shortness of breath with coughing and activity. Denies wheezing. She has had inhalers in the past but has stopped taking them due to the cost.  ? ?She has been having more pain in her legs recently. She pain is achy. She has a history of hypermobility. She has been taking mobic without improvement. She has taken prednisone packs in the past with improvement of her symptoms. She denies injury, swelling, numbness, tingling, or erythema.  ? ? ?  07/09/2021  ?  1:14 PM 06/11/2021  ? 10:15 AM 10/24/2020  ?  2:27 PM  ?Depression screen PHQ 2/9  ?Decreased Interest 1 2 2   ?Down, Depressed, Hopeless 2 2 1   ?PHQ - 2 Score 3 4 3   ?Altered sleeping 2 2 2   ?Tired, decreased energy 2 2 2   ?Change in appetite 1 2 1   ?Feeling bad or failure about yourself  2 1 1   ?Trouble concentrating 2 1 1   ?Moving slowly or fidgety/restless 0 0 0  ?Suicidal thoughts 0 0 0  ?PHQ-9 Score 12 12 10   ?Difficult doing work/chores Very difficult Somewhat difficult Not difficult at all  ? ? ?  07/09/2021  ?  1:15 PM 06/11/2021  ? 10:16 AM 10/24/2020  ?  2:27 PM 07/25/2020  ?  3:26 PM  ?GAD 7 : Generalized Anxiety Score  ?Nervous, Anxious, on Edge 2 2 1 1   ?Control/stop worrying 2 2 1 1   ?Worry too much - different things 2 2 1 1   ?Trouble relaxing 2 2 1 1   ?Restless 2 2 0 0   ?Easily annoyed or irritable 3 2 0 1  ?Afraid - awful might happen 2 2 0 0  ?Total GAD 7 Score 15 14 4 5   ?Anxiety Difficulty Very difficult Somewhat difficult Somewhat difficult Somewhat difficult  ? ? ? ? ? ?ROS ?Negative unless specially indicated above in HPI. ?  ?Objective:  ?  ? ?BP 125/70   Pulse 85   Temp 98.7 ?F (37.1 ?C) (Temporal)   Ht 5\' 8"  (1.727 m)   Wt 223 lb 4 oz (101.3 kg)   SpO2 100%   BMI 33.95 kg/m?  ? ? ?Physical Exam ?Vitals and nursing note reviewed.  ?Constitutional:   ?   General: She is not in acute distress. ?   Appearance: She is not ill-appearing, toxic-appearing or diaphoretic.  ?HENT:  ?   Head: Normocephalic and atraumatic.  ?   Right Ear: Tympanic membrane, ear canal and external ear normal.  ?   Left Ear: Tympanic membrane, ear canal and external ear normal.  ?   Nose:  ?   Comments: Dried blood visualized in both nares ?   Mouth/Throat:  ?   Mouth: Mucous membranes are  moist.  ?   Pharynx: Oropharynx is clear.  ?Eyes:  ?   Conjunctiva/sclera: Conjunctivae normal.  ?   Pupils: Pupils are equal, round, and reactive to light.  ?Cardiovascular:  ?   Rate and Rhythm: Normal rate and regular rhythm.  ?   Heart sounds: Normal heart sounds. No murmur heard. ?Pulmonary:  ?   Effort: Pulmonary effort is normal. No respiratory distress.  ?   Breath sounds: Normal breath sounds.  ?Abdominal:  ?   General: Bowel sounds are normal. There is no distension.  ?   Palpations: Abdomen is soft.  ?   Tenderness: There is no abdominal tenderness. There is no guarding or rebound.  ?Musculoskeletal:  ?   Right upper leg: Normal.  ?   Left upper leg: Normal.  ?   Right knee: No swelling, effusion or bony tenderness. No tenderness. Normal alignment and normal patellar mobility.  ?   Left knee: No swelling, deformity, effusion or bony tenderness. No tenderness. Normal alignment and normal patellar mobility.  ?   Right lower leg: Normal. No edema.  ?   Left lower leg: Normal. No edema.  ?Skin: ?    General: Skin is warm and dry.  ?Neurological:  ?   Mental Status: She is alert and oriented to person, place, and time.  ?   Motor: No weakness.  ?   Gait: Gait normal.  ?Psychiatric:     ?   Mood and Affect: Mood normal.     ?   Behavior: Behavior normal.  ? ? ? ?No results found for any visits on 07/09/21. ? ? ? ?The ASCVD Risk score (Arnett DK, et al., 2019) failed to calculate for the following reasons: ?  The 2019 ASCVD risk score is only valid for ages 59 to 38 ? ?  ?Assessment & Plan:  ? ?Mamta was seen today for depression. ? ?Diagnoses and all orders for this visit: ? ?Recurrent major depressive disorder, in partial remission (HCC) ?Start Celexa daily. Denies SI.  ?-     citalopram (CELEXA) 20 MG tablet; Take 1 tablet (20 mg total) by mouth daily. ? ?Generalized anxiety disorder ?Start celexa.  ?-     citalopram (CELEXA) 20 MG tablet; Take 1 tablet (20 mg total) by mouth daily. ? ?Arthralgia of both lower legs ?Benign joint hypermobility syndrome ?Will switch from mobic to voltaren. Prednisone taper ordered. Tylenol, stretching, rest, heat.  ?-     diclofenac (VOLTAREN) 75 MG EC tablet; Take 1 tablet (75 mg total) by mouth 2 (two) times daily. ?-     predniSONE (STERAPRED UNI-PAK 21 TAB) 10 MG (21) TBPK tablet; Use as directed on back of pill pack ? ?Simple chronic bronchitis (HCC) ?Start anoro ellipta.  ?-     umeclidinium-vilanterol (ANORO ELLIPTA) 62.5-25 MCG/ACT AEPB; Inhale 1 puff into the lungs daily. ? ?Epistaxis ?Discussed this is the likely cause of blood on pillow during sleep. Dried blood in nares noted on exam today. Nasal saline, humidifier.  ? ? ?Return in about 6 weeks (around 08/20/2021) for medication follow up.  ? ?The patient indicates understanding of these issues and agrees with the plan. ? ?Gabriel Earing, FNP ? ?

## 2021-07-09 NOTE — Patient Instructions (Signed)
Nosebleed, Adult A nosebleed is when blood comes out of the nose. Nosebleeds are common. Usually, they are not a sign of a serious condition. Nosebleeds can happen if a blood vessel in your nose starts to bleed or if the lining of your nose (mucous membrane) cracks. They are commonly caused by: Allergies. Colds. Picking your nose. Blowing your nose too hard. An injury from sticking an object into your nose or getting hit in the nose. Dry or cold air. Less common causes of nosebleeds include: Toxic fumes. Something abnormal in the nose or in the air-filled spaces in the bones of the face (sinuses). Growths in the nose, such as polyps. Blood thinners or conditions that cause blood to clot slowly. Certain illnesses or procedures that irritate or dry out the nasal passages. Follow these instructions at home: When you have a nosebleed:  Sit down and tilt your head slightly forward. Use a clean towel or tissue to pinch your nostrils under the bony part of your nose. After 5 minutes, let go of your nose and see if bleeding starts again. Do not release pressure before that time. If there is still bleeding, repeat the pinching and holding for 5 minutes or until the bleeding stops. Do not place tissues or gauze in the nose to stop the bleeding. Avoid lying down and avoid tilting your head backward. That may make blood collect in the throat and cause gagging or coughing. Use a nasal spray decongestant to help with a nosebleed as told by your health care provider. After a nosebleed: Avoid blowing your nose or sniffing for a number of hours. Avoid straining, lifting, or bending at the waist for several days. You may go back to other normal activities as you are able. If you are taking aspirin or blood thinners and you have nosebleeds, talk to your health care provider. These medicines make bleeding more likely. Ask your health care provider if you should stop taking the medicines or if you should  adjust the dose. Do not stop taking medicines that your health care provider has recommended unless he or she tells you to stop taking them. If your nosebleed was caused by dry mucous membranes, use over-the-counter saline nasal spray or gel and a humidifier as told by your health care provider. This will keep the mucous membranes moist and allow them to heal. If you need to use one of these products: Choose one that is water-soluble. Use only as much as you need and use it only as often as needed. Do not lie down right after you use it. If you get nosebleeds often, talk with your health care provider about medical treatments. Options may include: Nasal cautery. This treatment stops and prevents nosebleeds by using a chemical swab or electrical device to lightly burn tiny blood vessels inside the nose. Nasal packing. A gauze or other material is placed in the nose to keep constant pressure on the bleeding area. Contact a health care provider if you: Have a fever. Get nosebleeds often or more often than usual. Bruise very easily. Have a nosebleed from having something stuck in your nose. Have bleeding in your mouth. Vomit or cough up brown material. Have a nosebleed after you start a new medicine. Get help right away if: You have a nosebleed after a fall or a head injury. Your nosebleed does not go away after 20 minutes. You feel dizzy or weak. You have unusual bleeding from other parts of your body. You have unusual bruising on   other parts of your body. You become sweaty. You vomit blood. Summary A nosebleed is when blood comes out of the nose. Common causes include allergies, an injury to the nose, or cold or dry air. Initial treatment includes applying pressure for 5 minutes. Moisturizing the nose with saline nasal spray or gel after a nosebleed may help prevent future bleeding. Get help right away if your nosebleed does not go away after 20 minutes. This information is not intended  to replace advice given to you by your health care provider. Make sure you discuss any questions you have with your health care provider. Document Revised: 12/10/2018 Document Reviewed: 12/10/2018 Elsevier Patient Education  2022 [employer]  

## 2021-07-18 ENCOUNTER — Other Ambulatory Visit: Payer: Self-pay | Admitting: Nurse Practitioner

## 2021-07-18 DIAGNOSIS — G2581 Restless legs syndrome: Secondary | ICD-10-CM

## 2021-08-06 ENCOUNTER — Other Ambulatory Visit: Payer: Self-pay | Admitting: Nurse Practitioner

## 2021-08-08 ENCOUNTER — Telehealth: Payer: Self-pay | Admitting: Nurse Practitioner

## 2021-08-08 NOTE — Telephone Encounter (Signed)
I received this message from patient regarding an appt she cancelled online:  Patient Comments: She is not supposed to be my doctor I requested for tiffany and she said I could and was supposed to talk to [facility] about it I requested this about 2 months ago or so. Tiffany put me on this medicine so I would like to stick to her and talk to her thanks  I did not see documentation in pts chart of approval for pt to change providers.  Ok for pt to switch from [facility] to [facility]?

## 2021-08-09 NOTE — Telephone Encounter (Signed)
Ok to change to [employer] per MMM

## 2021-08-21 ENCOUNTER — Ambulatory Visit: Payer: BC Managed Care – PPO | Admitting: Nurse Practitioner

## 2021-08-22 ENCOUNTER — Ambulatory Visit: Payer: BC Managed Care – PPO | Admitting: Family Medicine

## 2021-08-22 ENCOUNTER — Encounter: Payer: Self-pay | Admitting: Family Medicine

## 2021-08-22 VITALS — BP 128/73 | HR 71 | Temp 98.0°F | Ht 68.0 in | Wt 225.2 lb

## 2021-08-22 DIAGNOSIS — M25562 Pain in left knee: Secondary | ICD-10-CM

## 2021-08-22 DIAGNOSIS — F411 Generalized anxiety disorder: Secondary | ICD-10-CM | POA: Diagnosis not present

## 2021-08-22 DIAGNOSIS — J410 Simple chronic bronchitis: Secondary | ICD-10-CM

## 2021-08-22 DIAGNOSIS — M357 Hypermobility syndrome: Secondary | ICD-10-CM

## 2021-08-22 DIAGNOSIS — F3341 Major depressive disorder, recurrent, in partial remission: Secondary | ICD-10-CM

## 2021-08-22 DIAGNOSIS — M25561 Pain in right knee: Secondary | ICD-10-CM

## 2021-08-22 MED ORDER — DICLOFENAC SODIUM 75 MG PO TBEC: 75.0000 mg | DELAYED_RELEASE_TABLET | Freq: Two times a day (BID) | ORAL | 1 refills | Status: DC

## 2021-08-22 MED ORDER — VENLAFAXINE HCL ER 37.5 MG PO CP24
37.5000 mg | ORAL_CAPSULE | Freq: Every day | ORAL | 0 refills | Status: DC
Start: 2021-08-22 — End: 2021-11-29

## 2021-08-22 MED ORDER — CITALOPRAM HYDROBROMIDE 40 MG PO TABS: 40.0000 mg | ORAL_TABLET | Freq: Every day | ORAL | 1 refills | Status: DC

## 2021-08-22 NOTE — Patient Instructions (Signed)

## 2021-08-22 NOTE — Progress Notes (Signed)
Established Patient Office Visit  Subjective   Patient ID: Cynthia Hayden, female    DOB: 1980-04-14  Age: 41 y.o. MRN: [id number]  Chief Complaint  Patient presents with   COPD   Depression    COPD She complains of shortness of breath. There is no hemoptysis, sputum production or wheezing. Pertinent negatives include no chest pain, malaise/fatigue, PND or weight loss. Her past medical history is significant for COPD.  Depression        Associated symptoms include no suicidal ideas.  Cynthia Hayden reports doing well with Celexa. Denies side effects. Has not noticed any side effects from decreased effexor dosage.   She was not able to pick up anoro due to cost. She reports that she only has shortness of breath with increased activity.   Voltaren has been helpful. She has not had any joint pain since she starting taking this. She has been taking it once a day.      08/22/2021   11:50 AM 07/09/2021    1:14 PM 06/11/2021   10:15 AM  Depression screen PHQ 2/9  Decreased Interest 1 1 2   Down, Depressed, Hopeless 1 2 2   PHQ - 2 Score 2 3 4   Altered sleeping 1 2 2   Tired, decreased energy 1 2 2   Change in appetite 0 1 2  Feeling bad or failure about yourself  1 2 1   Trouble concentrating 1 2 1   Moving slowly or fidgety/restless 0 0 0  Suicidal thoughts 0 0 0  PHQ-9 Score 6 12 12   Difficult doing work/chores Somewhat difficult Very difficult Somewhat difficult      08/22/2021   11:51 AM 07/09/2021    1:15 PM 06/11/2021   10:16 AM 10/24/2020    2:27 PM  GAD 7 : Generalized Anxiety Score  Nervous, Anxious, on Edge 1 2 2 1   Control/stop worrying 1 2 2 1   Worry too much - different things 1 2 2 1   Trouble relaxing 1 2 2 1   Restless 0 2 2 0  Easily annoyed or irritable 2 3 2  0  Afraid - awful might happen 0 2 2 0  Total GAD 7 Score 6 15 14 4   Anxiety Difficulty Somewhat difficult Very difficult Somewhat difficult Somewhat difficult      Past Medical History:  Diagnosis Date    Anxiety    Arthritis    Depression    Hypermobility syndrome    Migraine    RLS (restless legs syndrome)       Review of Systems  Constitutional:  Negative for chills, diaphoresis, malaise/fatigue and weight loss.  Respiratory:  Positive for shortness of breath. Negative for hemoptysis, sputum production and wheezing.   Cardiovascular:  Negative for chest pain, orthopnea, claudication, leg swelling and PND.  Musculoskeletal:  Negative for falls and joint pain.  Psychiatric/Behavioral:  Positive for depression. Negative for hallucinations, memory loss and suicidal ideas.      Objective:     Ht 5\' 8"  (1.727 m)   Wt 225 lb 4 oz (102.2 kg)   BMI 34.25 kg/m  BP Readings from Last 3 Encounters:  08/22/21 128/73  07/09/21 125/70  06/11/21 125/82      Physical Exam Vitals and nursing note reviewed.  Constitutional:      General: She is not in acute distress.    Appearance: She is not ill-appearing, toxic-appearing or diaphoretic.  HENT:     Head: Normocephalic and atraumatic.  Cardiovascular:     Rate and  Rhythm: Normal rate and regular rhythm.     Heart sounds: Normal heart sounds. No murmur heard. Pulmonary:     Effort: Pulmonary effort is normal. No respiratory distress.     Breath sounds: Normal breath sounds.  Musculoskeletal:     Right lower leg: No edema.     Left lower leg: No edema.  Skin:    General: Skin is warm and dry.  Neurological:     General: No focal deficit present.     Mental Status: She is alert and oriented to person, place, and time.  Psychiatric:        Mood and Affect: Mood normal.        Behavior: Behavior normal.        Thought Content: Thought content normal.        Judgment: Judgment normal.      No results found for any visits on 08/22/21.    The ASCVD Risk score (Arnett DK, et al., 2019) failed to calculate for the following reasons:   The 2019 ASCVD risk score is only valid for ages 2 to 26    Assessment & Plan:   Cynthia Hayden  was seen today for copd and depression.  Diagnoses and all orders for this visit:  Recurrent major depressive disorder, in partial remission (HCC) Generalized anxiety disorder Improved. Increase celexa to 40 mg daily. Decrease effexor to 37.5 mg daily.  -     citalopram (CELEXA) 40 MG tablet; Take 1 tablet (40 mg total) by mouth daily. -     venlafaxine XR (EFFEXOR XR) 37.5 MG 24 hr capsule; Take 1 capsule (37.5 mg total) by mouth daily with breakfast.  Arthralgia of both lower legs Benign joint hypermobility syndrome Well controlled on current regimen.  -     diclofenac (VOLTAREN) 75 MG EC tablet; Take 1 tablet (75 mg total) by mouth 2 (two) times daily.   Simple chronic bronchitis Albuterol prn. Well controlled on current regimen.    Return in about 6 weeks (around 10/03/2021) for medicaiton follow up.   The patient indicates understanding of these issues and agrees with the plan.  Gabriel Earing, FNP

## 2021-10-17 ENCOUNTER — Other Ambulatory Visit: Payer: Self-pay | Admitting: Nurse Practitioner

## 2021-10-17 DIAGNOSIS — G2581 Restless legs syndrome: Secondary | ICD-10-CM

## 2021-11-05 ENCOUNTER — Other Ambulatory Visit: Payer: Self-pay | Admitting: Nurse Practitioner

## 2021-11-05 DIAGNOSIS — M357 Hypermobility syndrome: Secondary | ICD-10-CM

## 2021-11-28 ENCOUNTER — Other Ambulatory Visit: Payer: Self-pay | Admitting: Family Medicine

## 2021-11-28 DIAGNOSIS — F3341 Major depressive disorder, recurrent, in partial remission: Secondary | ICD-10-CM

## 2021-11-28 DIAGNOSIS — F411 Generalized anxiety disorder: Secondary | ICD-10-CM

## 2021-12-21 ENCOUNTER — Ambulatory Visit: Payer: BC Managed Care – PPO | Admitting: Family Medicine

## 2021-12-21 ENCOUNTER — Encounter: Payer: Self-pay | Admitting: Family Medicine

## 2021-12-21 VITALS — BP 125/75 | HR 67 | Temp 98.1°F | Ht 68.0 in | Wt 240.0 lb

## 2021-12-21 DIAGNOSIS — F411 Generalized anxiety disorder: Secondary | ICD-10-CM

## 2021-12-21 DIAGNOSIS — Z23 Encounter for immunization: Secondary | ICD-10-CM | POA: Diagnosis not present

## 2021-12-21 DIAGNOSIS — I739 Peripheral vascular disease, unspecified: Secondary | ICD-10-CM | POA: Insufficient documentation

## 2021-12-21 DIAGNOSIS — F3341 Major depressive disorder, recurrent, in partial remission: Secondary | ICD-10-CM | POA: Diagnosis not present

## 2021-12-21 DIAGNOSIS — G43E09 Chronic migraine with aura, not intractable, without status migrainosus: Secondary | ICD-10-CM

## 2021-12-21 DIAGNOSIS — F4310 Post-traumatic stress disorder, unspecified: Secondary | ICD-10-CM | POA: Diagnosis not present

## 2021-12-21 DIAGNOSIS — J410 Simple chronic bronchitis: Secondary | ICD-10-CM

## 2021-12-21 MED ORDER — VENLAFAXINE HCL 25 MG PO TABS: 25.0000 mg | ORAL_TABLET | Freq: Every day | ORAL | 0 refills | Status: DC

## 2021-12-21 MED ORDER — ARIPIPRAZOLE 5 MG PO TABS: 5.0000 mg | ORAL_TABLET | Freq: Every day | ORAL | 1 refills | Status: DC

## 2021-12-21 NOTE — Addendum Note (Signed)
Addended by: Milas Hock on: 12/21/2021 09:51 AM   Modules accepted: Orders

## 2021-12-21 NOTE — Patient Instructions (Signed)
Managing Post-Traumatic Stress Disorder If you have been diagnosed with post-traumatic stress disorder (PTSD), you may be relieved that you now know why you have felt or behaved a certain way. Still, you may feel overwhelmed and concerned for your future.  With the right treatment and support, you can live your life with fewer symptoms. What actions can I take to manage PTSD? Manage stress Stress is your body's reaction to life changes and events, both good and bad. Stress can make PTSD worse. Take the following steps to manage stress: Talk with your health care provider or a counselor about ways to lower your stress. These may include: Physical exercise. Meditation, yoga, or other mind-body exercises. Exercises to relax your muscles. Breathing exercises. Listening to quiet music. Spending time outside. Eat a healthy diet. Get plenty of sleep. Take time to relax. Spend time with others. Talk with them about how you are feeling and what you need. Do activities that you enjoy. Have a schedule and take regular breaks. Take your medicines Symptoms of PTSD can make you feel depressed and anxious. They can also disrupt reality. Medicines can help lessen symptoms and make you feel better. It is important to: Talk with your pharmacist or health care provider about all medicines you take. Talk about the side effects. Discuss which medicines are safe to take together. Be involved in your care. Decide on the best treatment plan with your health care provider. Be aware that if you decide to take medicine, it can take 4-8 weeks before you notice a difference in your symptoms. Talk to your health care provider before stopping medicines. Stay close to family and friends PTSD can affect relationships. Make an effort to: Trust your friends and loved ones. Trusting others can help you feel safe and connect you with emotional support. Be open and honest about your feelings. Have fun and relax in safe  spaces, such as with friends and family. Think about going to couples counseling, family education classes, or family therapy. Therapy can be helpful for everyone. Talk with friends and family about how they can best support you. What are signs that my condition is getting worse? Be aware of your symptoms and how often you have them. The following symptoms mean that you may need additional help: You feel suspicious and angry. You have repeated flashbacks. You avoid going out or being with others. You have more fights with close friends or family members. You have thoughts about hurting yourself or others. You cannot get relief from feelings of depression or anxiety. Follow these instructions at home: Lifestyle Exercise at least 30 minutes a few times a week. Try to get 7-9 hours of sleep each night. To help with sleep: Keep your bedroom cool and dark. Do not eat a heavy meal within 1 hour of bedtime. Avoid caffeine for at least 8 hours before bed. Avoid screen time before bed. This includes television, computers, tablets, and mobile phones. Try to have fun and find humor in your life. Eat a healthy diet. Write your thoughts and feelings down in a journal or on a tablet or mobile phone. General instructions Take over-the-counter and prescription medicines only as told by your health care provider. Do not useillegal drugs. Know and remember that healing from trauma takes time. Keep your health care team up to date about your PTSD symptoms and treatment. Alcohol use Do not drink alcohol if: Your health care provider tells you not to drink. You are pregnant, may be pregnant, or are   planning to become pregnant. If you drink alcohol: Limit how much you have to: 0-1 drink a day for women. 0-2 drinks a day for men. Know how much alcohol is in your drink. In the [place] one drink equals one 12 oz bottle of beer (355 mL), one 5 oz glass of wine (148 mL), or one 1 oz glass of hard liquor (44  mL). Where to find more information For more information about PTSD, treatment, and how to get support, go to: National Institute of Mental Health: nimh.nih.gov National Center for PTSD: ptsd.va.gov Contact a health care provider if: Your symptoms get worse or do not get better. You have trouble doing daily tasks. You have loss of appetite. You have trouble sleeping. Get help right away if: You have thoughts about hurting yourself or others. Get help right away if you feel like you may hurt yourself or others, or have thoughts about taking your own life. Go to your nearest emergency room or: Call 911. Call the National Suicide Prevention Lifeline at 1-800-273-8255 or 988. This is open 24 hours a day. Text the Crisis Text Line at 741741. Summary With the right treatment and support, you can live your life with fewer symptoms. Find supportive people and settings. Spend time in those places. Keep in contact with those people. Work with your health care team to create a plan to manage the symptoms of PTSD. The plan should include counseling, good habits, and techniques to reduce stress. This information is not intended to replace advice given to you by your health care provider. Make sure you discuss any questions you have with your health care provider. Document Revised: 06/01/2021 Document Reviewed: 06/01/2021 Elsevier Patient Education  2023 [employer] Inc[employer]  

## 2021-12-21 NOTE — Progress Notes (Signed)
Established Patient Office Visit  Subjective   Patient ID: Cynthia Hayden, female    DOB: Dec 15, 1980  Age: 41 y.o. MRN: [id number]  Chief Complaint  Patient presents with   Medical Management of Chronic Issues   Depression    HPI  1.COPD  Reports symptoms are stable. Non productive cough. No chest pain or fevers. Occasionally has wheezing. Uses albuterol rarely. Continues to smoke. She has not started maintenance inhalers due to cost.   2. Depression She last increased her Celexa and weaned down of effexor. She reprots a very traumatic event that happened 2 months ago. She reports that her symptoms have been worse since then. She has tried and failed prozac and buspar in the past.   3. Migraines She has been taking topamax 200 mg at once rather than 100 BID. She has been having more migraines over the last few months. She has been having a migraine at least 3x a week. Immitrex does work well.      12/21/2021    8:53 AM 08/22/2021   11:50 AM 07/09/2021    1:14 PM  Depression screen PHQ 2/9  Decreased Interest 3 1 1   Down, Depressed, Hopeless 3 1 2   PHQ - 2 Score 6 2 3   Altered sleeping 3 1 2   Tired, decreased energy 3 1 2   Change in appetite 0 0 1  Feeling bad or failure about yourself  1 1 2   Trouble concentrating 2 1 2   Moving slowly or fidgety/restless 0 0 0  Suicidal thoughts 0 0 0  PHQ-9 Score 15 6 12   Difficult doing work/chores Very difficult Somewhat difficult Very difficult      12/21/2021    8:53 AM 08/22/2021   11:51 AM 07/09/2021    1:15 PM 06/11/2021   10:16 AM  GAD 7 : Generalized Anxiety Score  Nervous, Anxious, on Edge 1 1 2 2   Control/stop worrying 1 1 2 2   Worry too much - different things 2 1 2 2   Trouble relaxing 1 1 2 2   Restless 0 0 2 2  Easily annoyed or irritable 3 2 3 2   Afraid - awful might happen 0 0 2 2  Total GAD 7 Score 8 6 15 14   Anxiety Difficulty Somewhat difficult Somewhat difficult Very difficult Somewhat difficult      Past  Medical History:  Diagnosis Date   Anxiety    Arthritis    Depression    Hypermobility syndrome    Migraine    RLS (restless legs syndrome)       Review of Systems  Constitutional:  Negative for chills and diaphoresis.  Cardiovascular:  Negative for orthopnea, claudication and leg swelling.  Musculoskeletal:  Negative for falls and joint pain.     Objective:     BP 125/75   Pulse 67   Temp 98.1 F (36.7 C) (Temporal)   Ht 5\' 8"  (1.727 m)   Wt 240 lb (108.9 kg)   SpO2 100%   BMI 36.49 kg/m  BP Readings from Last 3 Encounters:  12/21/21 125/75  08/22/21 128/73  07/09/21 125/70      Physical Exam Vitals and nursing note reviewed.  Constitutional:      General: She is not in acute distress.    Appearance: She is not ill-appearing, toxic-appearing or diaphoretic.  HENT:     Head: Normocephalic and atraumatic.  Cardiovascular:     Rate and Rhythm: Normal rate and regular rhythm.     Heart  sounds: Normal heart sounds. No murmur heard. Pulmonary:     Effort: Pulmonary effort is normal. No respiratory distress.     Breath sounds: Normal breath sounds.  Abdominal:     General: Bowel sounds are normal. There is no distension.     Palpations: Abdomen is soft.     Tenderness: There is no abdominal tenderness. There is no guarding or rebound.  Musculoskeletal:     Right lower leg: No edema.     Left lower leg: No edema.  Skin:    General: Skin is warm and dry.  Neurological:     General: No focal deficit present.     Mental Status: She is alert and oriented to person, place, and time.  Psychiatric:        Mood and Affect: Mood normal.        Behavior: Behavior normal.        Thought Content: Thought content normal.        Judgment: Judgment normal.      No results found for any visits on 12/21/21.    The ASCVD Risk score (Arnett DK, et al., 2019) failed to calculate for the following reasons:   The 2019 ASCVD risk score is only valid for ages 63 to 40     Assessment & Plan:   [name] was seen today for medical management of chronic issues and depression.  Diagnoses and all orders for this visit:  Recurrent major depressive disorder, in partial remission (Cynthia Hayden) PTSD (post-traumatic stress disorder) Uncontrolled. Denies SI. Continue to wean off effexor over next 10 days, then discontinue. Continue celexa 40. Add abilify. Declined referral today. -     ARIPiprazole (ABILIFY) 5 MG tablet; Take 1 tablet (5 mg total) by mouth daily. -     venlafaxine (EFFEXOR) 25 MG tablet; Take 1 tablet (25 mg total) by mouth daily for 10 days.  Generalized anxiety disorder Fair control. Continue celexa.   Simple chronic bronchitis (HCC) Stable. Continue albuterol prn. Has declined maintenance medications.   Chronic migraine with aura without status migrainosus, not intractable Not well controlled. She will try topamax BID to see if this controls symptoms better. Continue imitrex prn. Will work to improve depression.   Need for vaccination Prevnar 20 and flu vaccine today.   Return in about 4 weeks (around 01/18/2022) for medication follow up.   The patient indicates understanding of these issues and agrees with the plan.  Gwenlyn Perking, FNP

## 2022-01-03 ENCOUNTER — Other Ambulatory Visit: Payer: Self-pay | Admitting: Family Medicine

## 2022-01-08 ENCOUNTER — Other Ambulatory Visit: Payer: Self-pay | Admitting: Family Medicine

## 2022-01-08 DIAGNOSIS — G2581 Restless legs syndrome: Secondary | ICD-10-CM

## 2022-01-23 ENCOUNTER — Encounter: Payer: Self-pay | Admitting: Family Medicine

## 2022-01-23 ENCOUNTER — Ambulatory Visit: Payer: BC Managed Care – PPO | Admitting: Family Medicine

## 2022-01-23 VITALS — BP 118/75 | HR 62 | Temp 98.1°F | Ht 68.0 in | Wt 244.0 lb

## 2022-01-23 DIAGNOSIS — L309 Dermatitis, unspecified: Secondary | ICD-10-CM | POA: Diagnosis not present

## 2022-01-23 DIAGNOSIS — J410 Simple chronic bronchitis: Secondary | ICD-10-CM

## 2022-01-23 DIAGNOSIS — F3341 Major depressive disorder, recurrent, in partial remission: Secondary | ICD-10-CM

## 2022-01-23 DIAGNOSIS — G43E09 Chronic migraine with aura, not intractable, without status migrainosus: Secondary | ICD-10-CM | POA: Diagnosis not present

## 2022-01-23 MED ORDER — ARIPIPRAZOLE 10 MG PO TABS: 10.0000 mg | ORAL_TABLET | Freq: Every day | ORAL | 3 refills | Status: DC

## 2022-01-23 MED ORDER — TRIAMCINOLONE ACETONIDE 0.5 % EX OINT: 1.0000 | TOPICAL_OINTMENT | Freq: Two times a day (BID) | CUTANEOUS | 1 refills | Status: DC

## 2022-01-23 MED ORDER — PREDNISONE 20 MG PO TABS: 40.0000 mg | ORAL_TABLET | Freq: Every day | ORAL | 0 refills | Status: AC

## 2022-01-23 NOTE — Progress Notes (Signed)
Established Patient Office Visit  Subjective   Patient ID: Cynthia Hayden, female    DOB: 1980-06-20  Age: 41 y.o. MRN: [id number]  Chief Complaint  Patient presents with   Depression    Depression        Tymara is here for a follow up of anxiety and depression. She has now weaned off Effexor completely. She continues on Celexa 40 mg daily. She started on Abilify 5 mg daily at her last visit. She reports an improvement with this. Denies side effects. Reports improvement in depression symptoms and decrease in nightmares. Her anxiety is about the same.   She also reports that migraines have improved with BID dosing. She has not had a headache in at least 10 days now.   She does have some redness, pain, and itching on the knuckles of her hand. This has been present for a few weeks. She has some mild swelling. She washes dishes frequently without gloves. She has had this occur in the past but it resolved.   She also reports an increase in COPD symptoms. She has had a increased cough with clear sputum. She also has had wheezing at night and increased shortness of breath with activity. She has not used her albuterol inhaler. She was unable to afford her maintenance inhaler. Denies fever, nasal congestion, chest pain, or edema.      01/23/2022    3:51 PM 12/21/2021    8:53 AM 08/22/2021   11:50 AM  Depression screen PHQ 2/9  Decreased Interest 2 3 1   Down, Depressed, Hopeless 1 3 1   PHQ - 2 Score 3 6 2   Altered sleeping 2 3 1   Tired, decreased energy 2 3 1   Change in appetite 0 0 0  Feeling bad or failure about yourself  1 1 1   Trouble concentrating 1 2 1   Moving slowly or fidgety/restless 0 0 0  Suicidal thoughts 0 0 0  PHQ-9 Score 9 15 6   Difficult doing work/chores Somewhat difficult Very difficult Somewhat difficult      01/23/2022    3:53 PM 12/21/2021    8:53 AM 08/22/2021   11:51 AM 07/09/2021    1:15 PM  GAD 7 : Generalized Anxiety Score  Nervous, Anxious, on Edge 1 1 1  2   Control/stop worrying 1 1 1 2   Worry too much - different things 1 2 1 2   Trouble relaxing 1 1 1 2   Restless 0 0 0 2  Easily annoyed or irritable 2 3 2 3   Afraid - awful might happen 2 0 0 2  Total GAD 7 Score 8 8 6 15   Anxiety Difficulty Somewhat difficult Somewhat difficult Somewhat difficult Very difficult       Review of Systems  Psychiatric/Behavioral:  Positive for depression.    As per HPI.    Objective:     BP 118/75   Pulse 62   Temp 98.1 F (36.7 C) (Temporal)   Ht 5\' 8"  (1.727 m)   Wt 244 lb (110.7 kg)   SpO2 98%   BMI 37.10 kg/m    Physical Exam Vitals and nursing note reviewed.  Constitutional:      General: She is not in acute distress.    Appearance: She is not ill-appearing, toxic-appearing or diaphoretic.  HENT:     Head: Normocephalic and atraumatic.     Nose: Nose normal.     Mouth/Throat:     Mouth: Mucous membranes are moist.     Pharynx:  Oropharynx is clear.  Eyes:     General:        Right eye: No discharge.        Left eye: No discharge.     Conjunctiva/sclera: Conjunctivae normal.  Cardiovascular:     Rate and Rhythm: Normal rate and regular rhythm.     Heart sounds: Normal heart sounds. No murmur heard. Pulmonary:     Effort: Pulmonary effort is normal. No respiratory distress.     Breath sounds: Normal breath sounds. No wheezing, rhonchi or rales.  Musculoskeletal:     Cervical back: Normal range of motion.     Right lower leg: No edema.     Left lower leg: No edema.  Skin:    General: Skin is warm and dry.  Neurological:     General: No focal deficit present.     Mental Status: She is alert and oriented to person, place, and time.  Psychiatric:        Mood and Affect: Mood normal.        Behavior: Behavior normal.        Thought Content: Thought content normal.        Judgment: Judgment normal.      No results found for any visits on 01/23/22.    The ASCVD Risk score (Arnett DK, et al., 2019) failed to  calculate for the following reasons:   The 2019 ASCVD risk score is only valid for ages 64 to 74    Assessment & Plan:   Chantella was seen today for depression.  Diagnoses and all orders for this visit:  Recurrent major depressive disorder, in partial remission (HCC) Improving. Increase abilify to 10 mg daily. Continue celexa. -     ARIPiprazole (ABILIFY) 10 MG tablet; Take 1 tablet (10 mg total) by mouth daily.  Chronic migraine with aura without status migrainosus, not intractable Improved with BID dosing of topamax, continue current regimen.   Hand eczema Kenalog as below. Discussed gloves when washing dishes.  -     triamcinolone ointment (KENALOG) 0.5 %; Apply 1 Application topically 2 (two) times daily.  Simple chronic bronchitis (HCC) Exacerbation of symptoms. Lungs clear on exam. Prednisone burst as below. Albuterol prn. She will contact her insurance regarding maintenance inhaler coverage.  -     predniSONE (DELTASONE) 20 MG tablet; Take 2 tablets (40 mg total) by mouth daily with breakfast for 5 days.   Return in about 8 weeks (around 03/20/2022) for chronic follow up.   The patient indicates understanding of these issues and agrees with the plan.   Gabriel Earing, FNP

## 2022-01-23 NOTE — Patient Instructions (Addendum)
Call insurance about maintenance inhaler for COPD coverage.   Dyshidrotic Eczema Dyshidrotic eczema, also known as pompholyx, is a type of eczema that causes very itchy, fluid-filled blisters (vesicles) to form on the hands and feet. It is more common before age 39, though it can affect people of any age. There is no cure, but treatment and certain lifestyle changes can help relieve symptoms. What are the causes? The cause of this condition is not known. What increases the risk? You are more likely to develop this condition if: You wash your hands frequently. You have a personal or family history of eczema, allergies, asthma, or hay fever. You are allergic to metals, such as nickel or cobalt. You work with cement. You smoke. What are the signs or symptoms? Symptoms of this condition may affect the hands, the feet, or both. Symptoms may come and go (recur), and may include: Severe itching. This may happen before blisters appear. Blisters. These may form suddenly. In the early stages, blisters may form near the fingertips. In severe cases, blisters may grow to large blister masses (bullae). Blisters resolve in 2-3 weeks without bursting. This is followed by a dry phase in which itching eases. Pain and swelling. Cracks or long, narrow openings (fissures) in the skin. Severe dryness. Ridges on the nails. How is this diagnosed? This condition may be diagnosed based on: Your symptoms and a physical exam. Your medical history. Skin scrapings to rule out a fungal infection. Testing a swab of fluid for bacteria (culture). Removing a small piece of skin (biopsy) to test for infection or to rule out other conditions. Skin patch tests. These tests involve using patches that contain possible allergens and placing them on your back. Your health care provider will wait a few days and then check to see if an allergic reaction occurred. These tests may be done if your health care provider suspects  allergic reactions, or to rule out other types of eczema. You may be referred to a health care provider who specializes in skin conditions (dermatologist) to help diagnose and treat this condition. How is this treated? There is no cure for this condition, but treatment can help relieve symptoms. Depending on the amount and severity of the blisters, your health care provider may suggest: Avoiding allergens, irritants, or triggers that worsen symptoms. This may involve lifestyle changes, such as: Using different lotions or soaps. Avoiding hot weather or places that will cause you to sweat a lot. Managing stress with coping techniques, such as relaxation and exercise, and asking for help when you need it. Diet changes as recommended by your health care provider. Using a clean, damp towel (cool compress) to relieve symptoms. Soaking in a bath that contains a type of salt that relieves irritation (aluminum acetate soaks). Medicines, such as: Medicine taken by mouth to reduce itching (oral antihistamines). Medicine applied to the skin to reduce swelling and irritation (topical corticosteroids). Medicine that reduces the activity of the body's disease-fighting system (immunosuppressants) to treat inflammation. This may be given in severe cases. Antibiotic medicines to treat bacterial infection. Light therapy (phototherapy). This involves shining ultraviolet (UV) light on the affected skin in order to reduce itchiness and inflammation. Follow these instructions at home: Bathing and skin care  Wash skin gently. After bathing or washing your hands, pat your skin dry. Avoid rubbing your skin. Remove all jewelry before bathing. If the skin under the jewelry stays wet, blisters may form or get worse. Apply cool compresses as told by your health  care provider. To do this: Soak a clean towel in cool water. Wring out excess water until towel is damp. Place the towel over the affected skin. Leave the towel  on for 20 minutes at a time, 2-3 times a day. Use mild soaps, cleansers, and lotions that do not contain dyes, perfumes, or other irritants. Keep your skin hydrated. To do this: Avoid very hot water. Take lukewarm baths or showers. Apply moisturizer within 3 minutes of bathing. This locks in moisture. Medicines Take and apply over-the-counter and prescription medicines only as told by your health care provider. If you were prescribed an antibiotic medicine, take or apply it as told by your health care provider. Do not stop using the antibiotic even if you start to feel better. General instructions Do not use any products that contain nicotine or tobacco. These include cigarettes, chewing tobacco, and vaping devices, such as e-cigarettes. If you need help quitting, ask your health care provider. Identify and avoid triggers and allergens. Keep fingernails short to avoid breaking the skin while scratching. Use waterproof gloves to protect your hands when doing work that keeps your hands wet for a long time. Wear socks to keep your feet dry. Keep all follow-up visits. This is important. Contact a health care provider if: You have symptoms that do not go away. You have signs of infection, such as: Crusting, pus, or a bad smell. More redness, swelling, or pain. Increased warmth in the affected area. Get help right away if: Your skin gets streaking redness with associated pain. Summary Dyshidrotic eczema, also known as pompholyx, is a type of eczema that causes very itchy, fluid-filled blisters (vesicles) to form on the hands and feet. The cause of this condition is not known. There is no cure for this condition, but treatment can help relieve symptoms. Treatment depends on the amount and severity of the blisters. Use mild soaps, cleansers, and lotions that do not contain dyes, perfumes, or other irritants. Keep your skin hydrated. This information is not intended to replace advice given to you  by your health care provider. Make sure you discuss any questions you have with your health care provider. Document Revised: 11/22/2019 Document Reviewed: 11/22/2019 Elsevier Patient Education  2023 [employer].

## 2022-01-30 ENCOUNTER — Other Ambulatory Visit: Payer: Self-pay | Admitting: Nurse Practitioner

## 2022-01-30 DIAGNOSIS — G43801 Other migraine, not intractable, with status migrainosus: Secondary | ICD-10-CM

## 2022-02-12 DIAGNOSIS — M25562 Pain in left knee: Secondary | ICD-10-CM | POA: Diagnosis not present

## 2022-02-19 ENCOUNTER — Other Ambulatory Visit: Payer: Self-pay | Admitting: Family Medicine

## 2022-02-19 DIAGNOSIS — F3341 Major depressive disorder, recurrent, in partial remission: Secondary | ICD-10-CM

## 2022-02-19 DIAGNOSIS — F411 Generalized anxiety disorder: Secondary | ICD-10-CM

## 2022-03-20 ENCOUNTER — Other Ambulatory Visit: Payer: Self-pay | Admitting: Family Medicine

## 2022-03-20 ENCOUNTER — Ambulatory Visit: Payer: BC Managed Care – PPO | Admitting: Family Medicine

## 2022-03-20 DIAGNOSIS — F3341 Major depressive disorder, recurrent, in partial remission: Secondary | ICD-10-CM

## 2022-03-20 DIAGNOSIS — F411 Generalized anxiety disorder: Secondary | ICD-10-CM

## 2022-03-22 ENCOUNTER — Encounter: Payer: Self-pay | Admitting: Family Medicine

## 2022-05-16 ENCOUNTER — Other Ambulatory Visit: Payer: Self-pay | Admitting: Family Medicine

## 2022-05-16 DIAGNOSIS — F3341 Major depressive disorder, recurrent, in partial remission: Secondary | ICD-10-CM

## 2022-05-16 DIAGNOSIS — F411 Generalized anxiety disorder: Secondary | ICD-10-CM

## 2022-05-20 ENCOUNTER — Other Ambulatory Visit: Payer: Self-pay | Admitting: Family Medicine

## 2022-05-20 DIAGNOSIS — F411 Generalized anxiety disorder: Secondary | ICD-10-CM

## 2022-05-20 DIAGNOSIS — F3341 Major depressive disorder, recurrent, in partial remission: Secondary | ICD-10-CM

## 2022-05-20 NOTE — Telephone Encounter (Signed)
Pt called requesting a refill on her Celexa Rx. Explained to pt that she is past due for an appt and needs to schedule. Pt scheduled an appt with PCP on 06/06/22 (first available). Requesting that enough medicine be sent in to last her until her appt.

## 2022-06-06 ENCOUNTER — Encounter: Payer: Self-pay | Admitting: Family Medicine

## 2022-06-06 ENCOUNTER — Ambulatory Visit: Payer: BC Managed Care – PPO | Admitting: Family Medicine

## 2022-06-06 VITALS — BP 115/77 | HR 75 | Temp 98.3°F | Ht 68.0 in | Wt 255.5 lb

## 2022-06-06 DIAGNOSIS — F411 Generalized anxiety disorder: Secondary | ICD-10-CM

## 2022-06-06 DIAGNOSIS — M791 Myalgia, unspecified site: Secondary | ICD-10-CM | POA: Diagnosis not present

## 2022-06-06 DIAGNOSIS — F3341 Major depressive disorder, recurrent, in partial remission: Secondary | ICD-10-CM | POA: Diagnosis not present

## 2022-06-06 DIAGNOSIS — R5383 Other fatigue: Secondary | ICD-10-CM

## 2022-06-06 DIAGNOSIS — R635 Abnormal weight gain: Secondary | ICD-10-CM

## 2022-06-06 DIAGNOSIS — R6889 Other general symptoms and signs: Secondary | ICD-10-CM | POA: Diagnosis not present

## 2022-06-06 MED ORDER — CITALOPRAM HYDROBROMIDE 40 MG PO TABS: 40.0000 mg | ORAL_TABLET | Freq: Every day | ORAL | 3 refills | Status: DC

## 2022-06-06 NOTE — Progress Notes (Signed)
Established Patient Office Visit  Subjective   Patient ID: Cynthia Hayden, female    DOB: September 06, 1980  Age: 42 y.o. MRN: [id number]  Chief Complaint  Patient presents with   Depression   Medical Management of Chronic Issues    HPI Sanjuana is here for a follow up of anxiety and depression. She has been compliant with celexa. She stopped taking ability a few months ago. She didn't notice a difference in symptoms when she stopped taking abilify. She reports that symptoms are not well controlled, primarily because of fatigue.   She reprots that she has been very sleepy for the last 2 months or so. She gets about 8 hours of sleep at night and then sleeps thoughout the day. She denies snoring. She wakes up feeling refreshed but then feels very fatigued after a few hours.. She denies fevers, weight loss, or edema. She has been having pain in both of her legs, neck, upper arms.  She does have a history of hypermobility disorder. She denies changes in hair, skin, nails, appetite. She has gained 30 lbs over the last years despite not eating much.   Reports that migraines are well controlled with topamax.      06/06/2022   11:48 AM 01/23/2022    3:51 PM 12/21/2021    8:53 AM  Depression screen PHQ 2/9  Decreased Interest 2 2 3   Down, Depressed, Hopeless 1 1 3   PHQ - 2 Score 3 3 6   Altered sleeping 3 2 3   Tired, decreased energy 3 2 3   Change in appetite 1 0 0  Feeling bad or failure about yourself  0 1 1  Trouble concentrating 3 1 2   Moving slowly or fidgety/restless 0 0 0  Suicidal thoughts 0 0 0  PHQ-9 Score 13 9 15   Difficult doing work/chores Very difficult Somewhat difficult Very difficult      06/06/2022   11:48 AM 01/23/2022    3:53 PM 12/21/2021    8:53 AM 08/22/2021   11:51 AM  GAD 7 : Generalized Anxiety Score  Nervous, Anxious, on Edge 0 1 1 1   Control/stop worrying 1 1 1 1   Worry too much - different things 1 1 2 1   Trouble relaxing 2 1 1 1   Restless 2 0 0 0  Easily  annoyed or irritable 3 2 3 2   Afraid - awful might happen 1 2 0 0  Total GAD 7 Score 10 8 8 6   Anxiety Difficulty Somewhat difficult Somewhat difficult Somewhat difficult Somewhat difficult       ROS Negative unless specially indicated above in HPI.   Objective:     BP 115/77   Pulse 75   Temp 98.3 F (36.8 C) (Temporal)   Ht 5\' 8"  (1.727 m)   Wt 255 lb 8 oz (115.9 kg)   SpO2 99%   BMI 38.85 kg/m    Physical Exam Vitals and nursing note reviewed.  Constitutional:      General: She is awake. She is not in acute distress.    Appearance: She is obese. She is not ill-appearing, toxic-appearing or diaphoretic.  HENT:     Head: Normocephalic and atraumatic.     Right Ear: Tympanic membrane, ear canal and external ear normal.     Left Ear: Tympanic membrane, ear canal and external ear normal.     Nose: Nose normal.     Mouth/Throat:     Mouth: Mucous membranes are moist.     Pharynx: Oropharynx  is clear.  Eyes:     Extraocular Movements: Extraocular movements intact.     Pupils: Pupils are equal, round, and reactive to light.  Neck:     Thyroid: No thyroid mass, thyromegaly or thyroid tenderness.  Cardiovascular:     Rate and Rhythm: Normal rate and regular rhythm.     Heart sounds: Normal heart sounds. No murmur heard. Pulmonary:     Effort: Pulmonary effort is normal. No respiratory distress.     Breath sounds: Normal breath sounds. No stridor. No wheezing, rhonchi or rales.  Abdominal:     General: Bowel sounds are normal. There is no distension.     Palpations: Abdomen is soft.     Tenderness: There is no abdominal tenderness. There is no guarding or rebound.  Musculoskeletal:     Cervical back: Neck supple. No rigidity.     Right lower leg: No edema.     Left lower leg: No edema.  Lymphadenopathy:     Cervical: No cervical adenopathy.  Skin:    General: Skin is warm and dry.  Neurological:     General: No focal deficit present.     Mental Status: She is  alert and oriented to person, place, and time.     Motor: No weakness.     Gait: Gait normal.  Psychiatric:        Mood and Affect: Mood normal.        Behavior: Behavior normal.        Thought Content: Thought content normal.        Judgment: Judgment normal.      No results found for any visits on 06/06/22.    The ASCVD Risk score (Arnett DK, et al., 2019) failed to calculate for the following reasons:   The 2019 ASCVD risk score is only valid for ages 60 to 42    Assessment & Plan:   Anastazia was seen today for depression and medical management of chronic issues.  Diagnoses and all orders for this visit:  Generalized anxiety disorder Recurrent major depressive disorder, in partial remission Not controlled. Denies SI. She declines changes in regimen. She would be open to changing regimen if labs are unremarkable.  -     citalopram (CELEXA) 40 MG tablet; Take 1 tablet (40 mg total) by mouth daily.  Other fatigue Will check labs as below. Denies snoring or gasping in sleep. Discussed could be due to depression if labs are unremarkable.  -     Anemia Profile B -     TSH -     T4, Free -     T3, Free -     Vitamin D, 25-hydroxy -     CMP14+EGFR  Myalgia Will check labs as below. She does have a hypermobility disorder.  -     Anemia Profile B -     ANA Comprehensive Panel -     C-reactive protein -     Sedimentation Rate -     CK -     CMP14+EGFR  Weight gain Will check labs as below. Reports poor diet.  -     TSH -     T4, Free -     T3, Free   Return in about 3 months (around 09/05/2022) for chronic follow up. Sooner for new or worsening symptoms.   The patient indicates understanding of these issues and agrees with the plan.   Gabriel Earing, FNP

## 2022-06-07 ENCOUNTER — Other Ambulatory Visit: Payer: Self-pay | Admitting: Family Medicine

## 2022-06-07 DIAGNOSIS — E559 Vitamin D deficiency, unspecified: Secondary | ICD-10-CM

## 2022-06-07 DIAGNOSIS — E538 Deficiency of other specified B group vitamins: Secondary | ICD-10-CM

## 2022-06-07 LAB — CMP14+EGFR
ALT: 15 IU/L (ref 0–32)
AST: 13 IU/L (ref 0–40)
Albumin/Globulin Ratio: 1.7 (ref 1.2–2.2)
Albumin: 4.1 g/dL (ref 3.9–4.9)
Alkaline Phosphatase: 87 IU/L (ref 44–121)
BUN/Creatinine Ratio: 13 (ref 9–23)
BUN: 11 mg/dL (ref 6–20)
Bilirubin Total: <0.2 mg/dL (ref 0.0–1.2)
CO2: 20 mmol/L (ref 20–29)
Calcium: 8.8 mg/dL (ref 8.7–10.2)
Chloride: 106 mmol/L (ref 96–106)
Creatinine, Ser: 0.82 mg/dL (ref 0.57–1.00)
Globulin, Total: 2.4 g/dL (ref 1.5–4.5)
Glucose: 85 mg/dL (ref 70–99)
Potassium: 4.3 mmol/L (ref 3.5–5.2)
Sodium: 138 mmol/L (ref 134–144)
Total Protein: 6.5 g/dL (ref 6.0–8.5)
eGFR: 93 mL/min/{1.73_m2} (ref >59)

## 2022-06-07 LAB — ANEMIA PROFILE B
Basophils Absolute: 0.1 10*3/uL (ref 0.0–0.2)
Basos: 1 %
EOS (ABSOLUTE): 0.1 10*3/uL (ref 0.0–0.4)
Eos: 1 %
Ferritin: 58 ng/mL (ref 15–150)
Folate: 2.9 ng/mL — ABNORMAL LOW (ref >3.0)
Hematocrit: 46.1 % (ref 34.0–46.6)
Hemoglobin: 15 g/dL (ref 11.1–15.9)
Immature Grans (Abs): 0 10*3/uL (ref 0.0–0.1)
Immature Granulocytes: 0 %
Iron Saturation: 18 % (ref 15–55)
Iron: 61 ug/dL (ref 27–159)
Lymphocytes Absolute: 2.2 10*3/uL (ref 0.7–3.1)
Lymphs: 23 %
MCH: 32.1 pg (ref 26.6–33.0)
MCHC: 32.5 g/dL (ref 31.5–35.7)
MCV: 99 fL — ABNORMAL HIGH (ref 79–97)
Monocytes Absolute: 0.6 10*3/uL (ref 0.1–0.9)
Monocytes: 6 %
Neutrophils Absolute: 6.8 10*3/uL (ref 1.4–7.0)
Neutrophils: 69 %
Platelets: 254 10*3/uL (ref 150–450)
RBC: 4.68 x10E6/uL (ref 3.77–5.28)
RDW: 12.8 % (ref 11.7–15.4)
Retic Ct Pct: 1.5 % (ref 0.6–2.6)
Total Iron Binding Capacity: 348 ug/dL (ref 250–450)
UIBC: 287 ug/dL (ref 131–425)
Vitamin B-12: 222 pg/mL — ABNORMAL LOW (ref 232–1245)
WBC: 9.8 10*3/uL (ref 3.4–10.8)

## 2022-06-07 LAB — C-REACTIVE PROTEIN: CRP: 1 mg/L (ref 0–10)

## 2022-06-07 LAB — SEDIMENTATION RATE: Sed Rate: 6 mm/hr (ref 0–32)

## 2022-06-07 LAB — ANA COMPREHENSIVE PANEL
Anti JO-1: <0.2 AI (ref 0.0–0.9)
Centromere Ab Screen: <0.2 AI (ref 0.0–0.9)
Chromatin Ab SerPl-aCnc: <0.2 AI (ref 0.0–0.9)
ENA RNP Ab: <0.2 AI (ref 0.0–0.9)
ENA SM Ab Ser-aCnc: <0.2 AI (ref 0.0–0.9)
ENA SSA (RO) Ab: <0.2 AI (ref 0.0–0.9)
ENA SSB (LA) Ab: <0.2 AI (ref 0.0–0.9)
Scleroderma (Scl-70) (ENA) Antibody, IgG: <0.2 AI (ref 0.0–0.9)
dsDNA Ab: <1 IU/mL (ref 0–9)

## 2022-06-07 LAB — T3, FREE: T3, Free: 2.6 pg/mL (ref 2.0–4.4)

## 2022-06-07 LAB — VITAMIN D 25 HYDROXY (VIT D DEFICIENCY, FRACTURES): Vit D, 25-Hydroxy: 7.3 ng/mL — ABNORMAL LOW (ref 30.0–100.0)

## 2022-06-07 LAB — TSH: TSH: 2.28 u[IU]/mL (ref 0.450–4.500)

## 2022-06-07 LAB — CK: Total CK: 67 U/L (ref 32–182)

## 2022-06-07 LAB — T4, FREE: Free T4: 0.89 ng/dL (ref 0.82–1.77)

## 2022-06-07 MED ORDER — VITAMIN D (ERGOCALCIFEROL) 1.25 MG (50000 UNIT) PO CAPS
50000.0000 [IU] | ORAL_CAPSULE | ORAL | 0 refills | Status: DC
Start: 2022-06-07 — End: 2022-08-30

## 2022-06-07 MED ORDER — FOLIC ACID 1 MG PO TABS
1.0000 mg | ORAL_TABLET | Freq: Every day | ORAL | 0 refills | Status: DC
Start: 2022-06-07 — End: 2022-09-11

## 2022-06-07 MED ORDER — VITAMIN B-12 1000 MCG PO TABS
1000.0000 ug | ORAL_TABLET | Freq: Every day | ORAL | 1 refills | Status: DC
Start: 2022-06-07 — End: 2023-06-12

## 2022-06-17 ENCOUNTER — Other Ambulatory Visit: Payer: Self-pay | Admitting: Nurse Practitioner

## 2022-07-01 ENCOUNTER — Ambulatory Visit: Payer: BC Managed Care – PPO | Admitting: Family Medicine

## 2022-07-30 ENCOUNTER — Other Ambulatory Visit: Payer: Self-pay | Admitting: Family Medicine

## 2022-07-30 DIAGNOSIS — G43801 Other migraine, not intractable, with status migrainosus: Secondary | ICD-10-CM

## 2022-08-07 ENCOUNTER — Ambulatory Visit: Payer: BC Managed Care – PPO | Admitting: Family Medicine

## 2022-08-07 ENCOUNTER — Encounter: Payer: Self-pay | Admitting: Family Medicine

## 2022-08-07 VITALS — BP 100/72 | HR 82 | Ht 68.0 in | Wt 247.0 lb

## 2022-08-07 DIAGNOSIS — Z3046 Encounter for surveillance of implantable subdermal contraceptive: Secondary | ICD-10-CM

## 2022-08-07 DIAGNOSIS — Z3009 Encounter for other general counseling and advice on contraception: Secondary | ICD-10-CM

## 2022-08-07 NOTE — Progress Notes (Signed)
BP 100/72   Pulse 82   Ht 5\' 8"  (1.727 m)   Wt 247 lb (112 kg)   SpO2 100%   BMI 37.56 kg/m    Subjective:   Patient ID: Cynthia Hayden, female    DOB: September 04, 1980, 42 y.o.   MRN: [id number]  HPI: Cynthia Hayden is a 42 y.o. female presenting on 08/07/2022 for Nexplanon Removal   HPI Patient coming in today for Nexplanon removal and discuss birth control options. She has had the Nexplanon for about 3 years and has had some weight gain and does not want to do the Nexplanon again.  She says she is married but not very sexually active and rarely has intercourse, maybe once or twice a year.  She is an active smoker over the age of 30.  Relevant past medical, surgical, family and social history reviewed and updated as indicated. Interim medical history since our last visit reviewed. Allergies and medications reviewed and updated.  Review of Systems  Constitutional:  Positive for unexpected weight change. Negative for chills and fever.  Eyes:  Negative for visual disturbance.  Respiratory:  Negative for chest tightness and shortness of breath.   Cardiovascular:  Negative for chest pain and leg swelling.  Genitourinary:  Negative for menstrual problem, vaginal bleeding, vaginal discharge and vaginal pain.  Musculoskeletal:  Negative for back pain and gait problem.  Skin:  Negative for rash.  Neurological:  Negative for dizziness, light-headedness and headaches.  Psychiatric/Behavioral:  Negative for agitation and behavioral problems.   All other systems reviewed and are negative.   Per HPI unless specifically indicated above   Allergies as of 08/07/2022       Reactions   Penicillins         Medication List        Accurate as of August 07, 2022  2:47 PM. If you have any questions, ask your nurse or doctor.          albuterol 108 (90 Base) MCG/ACT inhaler Commonly known as: VENTOLIN HFA Inhale 2 puffs into the lungs every 6 (six) hours as needed for wheezing or shortness  of breath.   ARIPiprazole 10 MG tablet Commonly known as: Abilify Take 1 tablet (10 mg total) by mouth daily.   citalopram 40 MG tablet Commonly known as: CELEXA Take 1 tablet (40 mg total) by mouth daily.   cyanocobalamin 1000 MCG tablet Commonly known as: VITAMIN B12 Take 1 tablet (1,000 mcg total) by mouth daily.   folic acid 1 MG tablet Commonly known as: FOLVITE Take 1 tablet (1 mg total) by mouth daily.   gabapentin 400 MG capsule Commonly known as: NEURONTIN TAKE TWO CAPSULES THREE TIMES DAILY What changed: See the new instructions.   multivitamin tablet Take 1 tablet by mouth daily.   pramipexole 1 MG tablet Commonly known as: MIRAPEX TAKE ONE (1) TABLET BY MOUTH 3 TIMES DAILY   SUMAtriptan 100 MG tablet Commonly known as: IMITREX TAKE 1 TABLET AT ONSET OF MIGRAINE HEADACHE MAY REPEAT ONCE IN 2 HOURS (MAX OF 2 TABLETS/24 HOURS)   topiramate 100 MG tablet Commonly known as: TOPAMAX TAKE ONE (1) TABLET BY MOUTH TWO (2) TIMES DAILY   triamcinolone ointment 0.5 % Commonly known as: KENALOG Apply 1 Application topically 2 (two) times daily.   Vitamin D (Ergocalciferol) 1.25 MG (50000 UNIT) Caps capsule Commonly known as: DRISDOL Take 1 capsule (50,000 Units total) by mouth every 7 (seven) days.  Objective:   BP 100/72   Pulse 82   Ht 5\' 8"  (1.727 m)   Wt 247 lb (112 kg)   SpO2 100%   BMI 37.56 kg/m   Wt Readings from Last 3 Encounters:  08/07/22 247 lb (112 kg)  06/06/22 255 lb 8 oz (115.9 kg)  01/23/22 244 lb (110.7 kg)    Physical Exam Vitals and nursing note reviewed.  Constitutional:      Appearance: Normal appearance. She is obese.  Neurological:     Mental Status: She is alert.     Nexplanon removal: Nexplanon is palpable on her left upper inner arm. Site was prepped with Betadine. 2.36mL of 2% lidocaine without epinephrine were used for anesthesia. 15 blade was used to dissect down to 1 and of the device. Forceps were used to  grab the end of the device and extracted which came out intact and whole. Steri-Strips were placed to close the small incision. Pressure dressing was placed over the site to prevent bruising. Patient tolerated procedure well and bleeding was minimal.   Assessment & Plan:   Problem List Items Addressed This Visit   None Visit Diagnoses     Nexplanon removal    -  Primary   Birth control counseling           When discussing birth control options right now due to her being a smoker some options are not available and she says she is very infrequently sexually active and would like to use condoms for now, we did discuss the NuvaRing as an option with decreased risk compared to oral birth controls of blood clots Follow up plan: Return if symptoms worsen or fail to improve.  Counseling provided for all of the vaccine components No orders of the defined types were placed in this encounter.   Arville Care, MD [facility] 08/07/2022, 2:47 PM

## 2022-08-12 ENCOUNTER — Other Ambulatory Visit: Payer: Self-pay | Admitting: Family Medicine

## 2022-08-12 DIAGNOSIS — G2581 Restless legs syndrome: Secondary | ICD-10-CM

## 2022-08-22 ENCOUNTER — Encounter: Payer: Self-pay | Admitting: Family Medicine

## 2022-08-22 DIAGNOSIS — Z3009 Encounter for other general counseling and advice on contraception: Secondary | ICD-10-CM

## 2022-08-22 MED ORDER — ETONOGESTREL-ETHINYL ESTRADIOL 0.12-0.015 MG/24HR VA RING
VAGINAL_RING | VAGINAL | 12 refills | Status: DC
Start: 2022-08-22 — End: 2022-10-31

## 2022-08-30 ENCOUNTER — Other Ambulatory Visit: Payer: Self-pay | Admitting: Family Medicine

## 2022-08-30 DIAGNOSIS — E559 Vitamin D deficiency, unspecified: Secondary | ICD-10-CM

## 2022-09-05 ENCOUNTER — Encounter: Payer: Self-pay | Admitting: Family Medicine

## 2022-09-05 ENCOUNTER — Ambulatory Visit: Payer: BC Managed Care – PPO | Admitting: Family Medicine

## 2022-09-05 VITALS — BP 125/84 | HR 87 | Temp 98.3°F | Resp 20 | Ht 68.0 in | Wt 244.2 lb

## 2022-09-05 DIAGNOSIS — E538 Deficiency of other specified B group vitamins: Secondary | ICD-10-CM | POA: Diagnosis not present

## 2022-09-05 DIAGNOSIS — F411 Generalized anxiety disorder: Secondary | ICD-10-CM

## 2022-09-05 DIAGNOSIS — R0683 Snoring: Secondary | ICD-10-CM | POA: Diagnosis not present

## 2022-09-05 DIAGNOSIS — F3341 Major depressive disorder, recurrent, in partial remission: Secondary | ICD-10-CM

## 2022-09-05 DIAGNOSIS — R42 Dizziness and giddiness: Secondary | ICD-10-CM

## 2022-09-05 DIAGNOSIS — E559 Vitamin D deficiency, unspecified: Secondary | ICD-10-CM | POA: Diagnosis not present

## 2022-09-05 DIAGNOSIS — R5382 Chronic fatigue, unspecified: Secondary | ICD-10-CM | POA: Insufficient documentation

## 2022-09-05 DIAGNOSIS — R5383 Other fatigue: Secondary | ICD-10-CM

## 2022-09-05 NOTE — Progress Notes (Signed)
Established Patient Office Visit  Subjective   Patient ID: Cynthia Hayden, female    DOB: Feb 05, 1981  Age: 42 y.o. MRN: [id number]  Chief Complaint  Patient presents with   Medical Management of Chronic Issues    HPI Cynthia Hayden is here for a follow up.   She continues to feel fatigued. She sleeps 8 hours at night. She wakes up feeling rested, but often within a hour of waking up, she is very drowsy and will fall asleep if she sits down. She has good days are bad days. She hasn't noticed a difference with activity. She does report snoring.She did not schedule an appt with sleep studies.    She eats 1 meal a day. She drinks a pepsi and some coffee. She doesn't drink any day.   She is complaint with topamax. She is having 1-2 migraines a months lately. She has had more stress lately.   She has been compliant with celexa. She reports that she feels like her a anxiety and depression is well controlled and that her symptoms are related to her fatigue.      09/05/2022    2:28 PM 08/07/2022    2:31 PM 06/06/2022   11:48 AM  Depression screen PHQ 2/9  Decreased Interest 2 1 2   Down, Depressed, Hopeless 2 1 1   PHQ - 2 Score 4 2 3   Altered sleeping 3 3 3   Tired, decreased energy 3 3 3   Change in appetite 0 1 1  Feeling bad or failure about yourself  1 0 0  Trouble concentrating 3 2 3   Moving slowly or fidgety/restless 0 0 0  Suicidal thoughts 0 0 0  PHQ-9 Score 14 11 13   Difficult doing work/chores Extremely dIfficult Somewhat difficult Very difficult      09/05/2022    2:29 PM 08/07/2022    2:32 PM 06/06/2022   11:48 AM 01/23/2022    3:53 PM  GAD 7 : Generalized Anxiety Score  Nervous, Anxious, on Edge 2 1 0 1  Control/stop worrying 1 1 1 1   Worry too much - different things 1 1 1 1   Trouble relaxing 0 0 2 1  Restless 0 0 2 0  Easily annoyed or irritable 3 2 3 2   Afraid - awful might happen 0 0 1 2  Total GAD 7 Score 7 5 10 8   Anxiety Difficulty Somewhat difficult Somewhat  difficult Somewhat difficult Somewhat difficult       ROS Negative unless specially indicated above in HPI.   Objective:     BP 125/84   Pulse 87   Temp 98.3 F (36.8 C) (Oral)   Resp 20   Ht 5\' 8"  (1.727 m)   Wt 244 lb 4 oz (110.8 kg)   SpO2 93%   BMI 37.14 kg/m    Physical Exam Vitals and nursing note reviewed.  Constitutional:      General: She is awake. She is not in acute distress.    Appearance: She is obese. She is not ill-appearing, toxic-appearing or diaphoretic.  HENT:     Head: Normocephalic and atraumatic.     Right Ear: Tympanic membrane, ear canal and external ear normal.     Left Ear: Tympanic membrane, ear canal and external ear normal.     Nose: Nose normal.     Mouth/Throat:     Mouth: Mucous membranes are moist.     Pharynx: Oropharynx is clear.  Eyes:     Extraocular Movements: Extraocular movements  intact.     Pupils: Pupils are equal, round, and reactive to light.  Neck:     Thyroid: No thyroid mass, thyromegaly or thyroid tenderness.  Cardiovascular:     Rate and Rhythm: Normal rate and regular rhythm.     Heart sounds: Normal heart sounds. No murmur heard. Pulmonary:     Effort: Pulmonary effort is normal. No respiratory distress.     Breath sounds: Normal breath sounds. No stridor. No wheezing, rhonchi or rales.  Abdominal:     General: Bowel sounds are normal. There is no distension.     Palpations: Abdomen is soft.     Tenderness: There is no abdominal tenderness. There is no guarding or rebound.  Musculoskeletal:     Cervical back: Neck supple. No rigidity.     Right lower leg: No edema.     Left lower leg: No edema.  Lymphadenopathy:     Cervical: No cervical adenopathy.  Skin:    General: Skin is warm and dry.  Neurological:     General: No focal deficit present.     Mental Status: She is alert and oriented to person, place, and time.     Motor: No weakness.     Gait: Gait normal.  Psychiatric:        Mood and Affect:  Mood normal.        Behavior: Behavior normal.        Thought Content: Thought content normal.        Judgment: Judgment normal.      No results found for any visits on 09/05/22.    The ASCVD Risk score (Arnett DK, et al., 2019) failed to calculate for the following reasons:   The 2019 ASCVD risk score is only valid for ages 21 to 75    Assessment & Plan:   Cynthia Hayden was seen today for medical management of chronic issues.  Diagnoses and all orders for this visit:  Other fatigue Loud snoring Will recheck labs today. She had negative orthostatic were negative. EKG with NSR. Poor man's test for POTS was negative today. Discussed need for sleep study, new referral placed. Discussed hydration, diet.  -     CBC with Differential/Platelet -     CMP14+EGFR -     Iron, TIBC and Ferritin Panel -     Magnesium -     Vitamin B1 -     EKG 12-Lead -     Ambulatory referral to Sleep Studies  Vitamin D deficiency -     Vitamin D, 25-hydroxy  Folate deficiency -     Folate  B12 deficiency -     Vitamin B12  Lightheadedness -     EKG 12-Lead  Generalized anxiety disorder Recurrent major depressive disorder, in partial remission (HCC) Stable. Denies SI. Continue celexa.   Will determine follow up pending lab results.    The patient indicates understanding of these issues and agrees with the plan.   Gabriel Earing, FNP

## 2022-09-09 LAB — CMP14+EGFR
ALT: 19 IU/L (ref 0–32)
AST: 12 IU/L (ref 0–40)
Albumin: 4.2 g/dL (ref 3.9–4.9)
Alkaline Phosphatase: 86 IU/L (ref 44–121)
BUN/Creatinine Ratio: 13 (ref 9–23)
BUN: 11 mg/dL (ref 6–20)
Bilirubin Total: 0.3 mg/dL (ref 0.0–1.2)
CO2: 18 mmol/L — ABNORMAL LOW (ref 20–29)
Calcium: 9 mg/dL (ref 8.7–10.2)
Chloride: 105 mmol/L (ref 96–106)
Creatinine, Ser: 0.88 mg/dL (ref 0.57–1.00)
Globulin, Total: 2.7 g/dL (ref 1.5–4.5)
Glucose: 87 mg/dL (ref 70–99)
Potassium: 3.9 mmol/L (ref 3.5–5.2)
Sodium: 136 mmol/L (ref 134–144)
Total Protein: 6.9 g/dL (ref 6.0–8.5)
eGFR: 86 mL/min/{1.73_m2} (ref >59)

## 2022-09-09 LAB — CBC WITH DIFFERENTIAL/PLATELET
Basophils Absolute: 0.1 10*3/uL (ref 0.0–0.2)
Basos: 1 %
EOS (ABSOLUTE): 0.1 10*3/uL (ref 0.0–0.4)
Eos: 1 %
Hematocrit: 44.7 % (ref 34.0–46.6)
Hemoglobin: 14.8 g/dL (ref 11.1–15.9)
Immature Grans (Abs): 0 10*3/uL (ref 0.0–0.1)
Immature Granulocytes: 0 %
Lymphocytes Absolute: 2 10*3/uL (ref 0.7–3.1)
Lymphs: 20 %
MCH: 31 pg (ref 26.6–33.0)
MCHC: 33.1 g/dL (ref 31.5–35.7)
MCV: 94 fL (ref 79–97)
Monocytes Absolute: 0.5 10*3/uL (ref 0.1–0.9)
Monocytes: 5 %
Neutrophils Absolute: 7.3 10*3/uL — ABNORMAL HIGH (ref 1.4–7.0)
Neutrophils: 73 %
Platelets: 222 10*3/uL (ref 150–450)
RBC: 4.78 x10E6/uL (ref 3.77–5.28)
RDW: 12.4 % (ref 11.7–15.4)
WBC: 10 10*3/uL (ref 3.4–10.8)

## 2022-09-09 LAB — IRON,TIBC AND FERRITIN PANEL
Ferritin: 76 ng/mL (ref 15–150)
Iron Saturation: 26 % (ref 15–55)
Iron: 93 ug/dL (ref 27–159)
Total Iron Binding Capacity: 355 ug/dL (ref 250–450)
UIBC: 262 ug/dL (ref 131–425)

## 2022-09-09 LAB — VITAMIN B12: Vitamin B-12: 279 pg/mL (ref 232–1245)

## 2022-09-09 LAB — FOLATE: Folate: 10.9 ng/mL (ref >3.0)

## 2022-09-09 LAB — MAGNESIUM: Magnesium: 2 mg/dL (ref 1.6–2.3)

## 2022-09-09 LAB — VITAMIN B1: Thiamine: 105.7 nmol/L (ref 66.5–200.0)

## 2022-09-09 LAB — VITAMIN D 25 HYDROXY (VIT D DEFICIENCY, FRACTURES): Vit D, 25-Hydroxy: 36.3 ng/mL (ref 30.0–100.0)

## 2022-09-11 ENCOUNTER — Other Ambulatory Visit: Payer: Self-pay | Admitting: Family Medicine

## 2022-09-11 DIAGNOSIS — E538 Deficiency of other specified B group vitamins: Secondary | ICD-10-CM

## 2022-10-07 ENCOUNTER — Other Ambulatory Visit: Payer: Self-pay | Admitting: Family Medicine

## 2022-10-07 DIAGNOSIS — G2581 Restless legs syndrome: Secondary | ICD-10-CM

## 2022-10-25 ENCOUNTER — Other Ambulatory Visit: Payer: Self-pay | Admitting: Family Medicine

## 2022-10-25 DIAGNOSIS — M357 Hypermobility syndrome: Secondary | ICD-10-CM

## 2022-10-29 ENCOUNTER — Other Ambulatory Visit: Payer: Self-pay | Admitting: Family Medicine

## 2022-10-29 DIAGNOSIS — G43801 Other migraine, not intractable, with status migrainosus: Secondary | ICD-10-CM

## 2022-10-31 ENCOUNTER — Ambulatory Visit (INDEPENDENT_AMBULATORY_CARE_PROVIDER_SITE_OTHER): Payer: BC Managed Care – PPO | Admitting: Neurology

## 2022-10-31 ENCOUNTER — Encounter: Payer: Self-pay | Admitting: Neurology

## 2022-10-31 VITALS — BP 128/88 | HR 67 | Ht 68.0 in | Wt 243.0 lb

## 2022-10-31 DIAGNOSIS — E669 Obesity, unspecified: Secondary | ICD-10-CM

## 2022-10-31 DIAGNOSIS — R0683 Snoring: Secondary | ICD-10-CM

## 2022-10-31 DIAGNOSIS — G4719 Other hypersomnia: Secondary | ICD-10-CM

## 2022-10-31 DIAGNOSIS — Z9189 Other specified personal risk factors, not elsewhere classified: Secondary | ICD-10-CM

## 2022-10-31 DIAGNOSIS — R519 Headache, unspecified: Secondary | ICD-10-CM

## 2022-10-31 DIAGNOSIS — R635 Abnormal weight gain: Secondary | ICD-10-CM

## 2022-10-31 DIAGNOSIS — J449 Chronic obstructive pulmonary disease, unspecified: Secondary | ICD-10-CM

## 2022-10-31 DIAGNOSIS — F17200 Nicotine dependence, unspecified, uncomplicated: Secondary | ICD-10-CM

## 2022-10-31 NOTE — Progress Notes (Signed)
Subjective:    Patient ID: Cynthia Hayden is a 42 y.o. female.  HPI    Huston Foley, MD, PhD [facility] [address] [address] [place], [place] [address]  Dear Elmarie Shiley,  I saw your patient, Cynthia Hayden, upon your kind request in my sleep clinic today for initial consultation of her sleep disorder, in particular, concern for underlying obstructive sleep apnea.  The patient is accompanied by her daughter today.  As you know, Cynthia Hayden is a 42 year old female with an underlying medical history of vitamin B12 deficiency, vitamin D deficiency, arthritis, migraine headaches, COPD, heavy smoking, restless leg syndrome, anxiety, depression, and obesity, who reports snoring and excessive daytime somnolence, as well as waking up with a sense of gasping.  Her Epworth sleepiness score is 17 out of 24, fatigue severity score is 61 out of 63.  She lives with her family including husband and 2 children.  She currently does not work.  She does not keep a very set schedule for her sleep time.  She is generally in bed between 8 and 9 and may wake up between midnight and 3 AM, she will drink her morning coffee and go back to bed and naps throughout the day.  She drinks caffeine in the form of coffee, about 16 ounces early morning and altogether 1 bottle of soda over the course of the day.  She drinks alcohol infrequently, on special occasions, maybe twice a year.  She smokes about 2 packs/day, she is currently not working on smoking cessation.  She reports that she did have a pulmonologist in the past but did not go back.  Of note, she is on multiple potentially sedating medications.  She is currently taking Mirapex 1.5 mg at bedtime, gabapentin 800 mg at bedtime, Topamax 200 mg at bedtime, Celexa 40 mg at bedtime.  Her gabapentin prescription is for 400 mg 3 times daily.  Her pramipexole prescription is for Monday milligram 3 times daily.  Her Topamax prescription is for 100 mg twice  daily.   I reviewed your office note from 09/05/2022.  Does have a TV on in her bedroom at night but turns it off.  They have 2 dogs in the household, the dogs do not sleep in her bedroom.  She has had occasional morning headaches, denies nightly nocturia.  She is not aware of any family history of sleep apnea.  She has had significant weight gain within the past year, in the realm of 50 or 60 pounds as she estimates.  Her Past Medical History Is Significant For: Past Medical History:  Diagnosis Date   Anxiety    Arthritis    Depression    Hypermobility syndrome    Migraine    RLS (restless legs syndrome)     Her Past Surgical History Is Significant For: History reviewed. No pertinent surgical history.  Her Family History Is Significant For: Family History  Problem Relation Age of Onset   Diabetes Mother     Her Social History Is Significant For: Social History   Socioeconomic History   Marital status: Married    Spouse name: Not on file   Number of children: Not on file   Years of education: Not on file   Highest education level: Not on file  Occupational History   Not on file  Tobacco Use   Smoking status: Every Day    Current packs/day: 1.00    Types: Cigarettes   Smokeless tobacco: Never  Vaping Use   Vaping status: Never Used  Substance and Sexual Activity   Alcohol use: No   Drug use: No   Sexual activity: Not on file  Other Topics Concern   Not on file  Social History Narrative   Not on file   Social Determinants of Health   Financial Resource Strain: Not on file  Food Insecurity: Not on file  Transportation Needs: Not on file  Physical Activity: Not on file  Stress: Not on file  Social Connections: Not on file    Her Allergies Are:  Allergies  Allergen Reactions   Penicillins   :   Her Current Medications Are:  Outpatient Encounter Medications as of 10/31/2022  Medication Sig   citalopram (CELEXA) 40 MG tablet Take 1 tablet (40 mg total) by  mouth daily.   cyanocobalamin (VITAMIN B12) 1000 MCG tablet Take 1 tablet (1,000 mcg total) by mouth daily.   folic acid (FOLVITE) 1 MG tablet TAKE ONE (1) TABLET BY MOUTH EVERY DAY   gabapentin (NEURONTIN) 400 MG capsule TAKE TWO CAPSULES THREE TIMES DAILY (Patient taking differently: 2 (two) times daily.)   pramipexole (MIRAPEX) 1 MG tablet TAKE ONE (1) TABLET BY MOUTH 3 TIMES DAILY   SUMAtriptan (IMITREX) 100 MG tablet TAKE 1 TABLET AT ONSET OF MIGRAINE HEADACHE MAY REPEAT ONCE IN 2 HOURS (MAX OF 2 TABLETS/24 HOURS)   topiramate (TOPAMAX) 100 MG tablet TAKE ONE (1) TABLET BY MOUTH TWO (2) TIMES DAILY   Vitamin D, Ergocalciferol, (DRISDOL) 1.25 MG (50000 UNIT) CAPS capsule TAKE 1 CAPSULE BY MOUTH EVERY 7 DAYS   [DISCONTINUED] albuterol (PROVENTIL HFA;VENTOLIN HFA) 108 (90 Base) MCG/ACT inhaler Inhale 2 puffs into the lungs every 6 (six) hours as needed for wheezing or shortness of breath. (Patient not taking: Reported on 10/31/2022)   [DISCONTINUED] etonogestrel-ethinyl estradiol (NUVARING) 0.12-0.015 MG/24HR vaginal ring Insert vaginally and leave in place for 3 consecutive weeks, then remove for 1 week. (Patient not taking: Reported on 09/05/2022)   [DISCONTINUED] Multiple Vitamin (MULTIVITAMIN) tablet Take 1 tablet by mouth daily. (Patient not taking: Reported on 09/05/2022)   [DISCONTINUED] triamcinolone ointment (KENALOG) 0.5 % Apply 1 Application topically 2 (two) times daily. (Patient not taking: Reported on 10/31/2022)   No facility-administered encounter medications on file as of 10/31/2022.  :   Review of Systems:  Out of a complete 14 point review of systems, all are reviewed and negative with the exception of these symptoms as listed below:  Review of Systems  Neurological:        RM 9 with daughter Pt is well, reports she gets over 8 hrs of broken sleep a night. She can fall asleep easily during the day as well.  She gets up often between 12-3 am, drink coffee and can be up for the rest  of the day.  She also mentions she has occasionally woken up gasping for breath.     Objective:  Neurological Exam  Physical Exam Physical Examination:   Vitals:   10/31/22 1415  BP: 128/88  Pulse: 67    General Examination: The patient is a very pleasant 42 y.o. female in no acute distress. She appears well-developed and well-nourished and well groomed.   HEENT: Normocephalic, atraumatic, pupils are equal, round and reactive to light, extraocular tracking is good without limitation to gaze excursion or nystagmus noted. Hearing is grossly intact. Face is symmetric with normal facial animation. Speech is clear with no dysarthria noted. There is no hypophonia. There is no lip,  neck/head, jaw or voice tremor. Neck is supple with full range of passive and active motion. There are no carotid bruits on auscultation. Oropharynx exam reveals: mild mouth dryness, adequate dental hygiene, edentulous on top, no dentures, moderate airway crowding secondary to small airway entry and thicker uvula, which is bicuspid.  Tonsillar size of about 1-2+ bilaterally.  Mallampati class II.  Neck circumference 16-1/8 inches.  Tongue protrudes centrally and palate elevates symmetrically.    Chest: auscultation shows widespread rhonchi and intermittent wheeze throughout both lungs.  No crackles.  Heart: S1+S2+0, regular and normal without murmurs, rubs or gallops noted.   Abdomen: Soft, non-tender and non-distended.  Extremities: There is nonpitting puffiness in the distal lower extremities bilaterally.   Skin: Warm and dry without trophic changes noted.   Musculoskeletal: exam reveals no obvious joint deformities.   Neurologically:  Mental status: The patient is awake, alert and oriented in all 4 spheres. Her immediate and remote memory, attention, language skills and fund of knowledge are appropriate. There is no evidence of aphasia, agnosia, apraxia or anomia. Speech is clear with normal prosody and  enunciation. Thought process is linear. Mood is normal and affect is normal.  Cranial nerves II - XII are as described above under HEENT exam.  Motor exam: Normal bulk, strength and tone is noted. There is no obvious action or resting tremor.  Fine motor skills and coordination: grossly intact.  Cerebellar testing: No dysmetria or intention tremor. There is no truncal or gait ataxia.  Sensory exam: intact to light touch in the upper and lower extremities.  Gait, station and balance: She stands easily. No veering to one side is noted. No leaning to one side is noted. Posture is age-appropriate and stance is narrow based. Gait shows normal stride length and normal pace. No problems turning are noted.   Assessment and Plan:  In summary, Cynthia Hayden is a very pleasant 42 y.o.-year old female with an underlying medical history of vitamin B12 deficiency, vitamin D deficiency, arthritis, migraine headaches, COPD, heavy smoking, restless leg syndrome, anxiety, depression, and obesity, whose history and physical exam are concerning for sleep disordered breathing, particularly obstructive sleep apnea (OSA).  While a laboratory attended sleep study is typically considered "gold standard" for evaluation of sleep disordered breathing, we mutually agreed to proceed with a home sleep test at this time.   I had a long chat with the patient and her daughter about my findings and the diagnosis of sleep apnea, particularly OSA, its prognosis and treatment options. We talked about medical/conservative treatments, surgical interventions and non-pharmacological approaches for symptom control. I explained, in particular, the risks and ramifications of untreated moderate to severe OSA, especially with respect to developing cardiovascular disease down the road, including congestive heart failure (CHF), difficult to treat hypertension, cardiac arrhythmias (particularly A-fib), neurovascular complications including TIA, stroke and  dementia. Even type 2 diabetes has, in part, been linked to untreated OSA. Symptoms of untreated OSA may include (but may not be limited to) daytime sleepiness, nocturia (i.e. frequent nighttime urination), memory problems, mood irritability and suboptimally controlled or worsening mood disorder such as depression and/or anxiety, lack of energy, lack of motivation, physical discomfort, as well as recurrent headaches, especially morning or nocturnal headaches. We talked about the importance of maintaining a healthy lifestyle and striving for healthy weight.  The importance of complete smoking cessation was also addressed.  She is encouraged to talk to about smoking cessation, she is encouraged to establish with a pulmonologist again  and also talk to you about weight management including referral to a weight management clinic or nutritionist. In addition, we talked about the importance of striving for and maintaining good sleep hygiene. I recommended a sleep study at this time. I outlined the differences between a laboratory attended sleep study which is considered more comprehensive and accurate over the option of a home sleep test (HST); the latter may lead to underestimation of sleep disordered breathing in some instances and does not help with diagnosing upper airway resistance syndrome and is not accurate enough to diagnose primary central sleep apnea typically. I outlined possible surgical and non-surgical treatment options of OSA, including the use of a positive airway pressure (PAP) device (i.e. CPAP, AutoPAP/APAP or BiPAP in certain circumstances), a custom-made dental device (aka oral appliance, which would require a referral to a specialist dentist or orthodontist typically, and is generally speaking not considered for patients with full dentures or edentulous state), upper airway surgical options, such as traditional UPPP (which is not considered a first-line treatment) or the Inspire device (hypoglossal  nerve stimulator, which would involve a referral for consultation with an ENT surgeon, after careful selection, following inclusion criteria - also not first-line treatment). I explained the PAP treatment option to the patient in detail, as this is generally considered first-line treatment.  The patient indicated that she would be willing to try PAP therapy, if the need arises. I explained the importance of being compliant with PAP treatment, not only for insurance purposes but primarily to improve patient's symptoms symptoms, and for the patient's long term health benefit, including to reduce Her cardiovascular risks longer-term.    We will pick up our discussion about the next steps and treatment options after testing.  We will keep her posted as to the test results by phone call and/or MyChart messaging where possible.  We will plan to follow-up in sleep clinic accordingly as well.  I answered all their questions today and the patient and her daughter were in agreement.   I encouraged her to call with any interim questions, concerns, problems or updates or email [place] through MyChart.  Generally speaking, sleep test authorizations may take up to 2 weeks, sometimes less, sometimes longer, the patient is encouraged to get in touch with [place] if they do not hear back from the sleep lab staff directly within the next 2 weeks.  Thank you very much for allowing me to participate in the care of this nice patient. If I can be of any further assistance to you please do not hesitate to call me at [phone].  Sincerely,   Huston Foley, MD, PhD

## 2022-10-31 NOTE — Patient Instructions (Signed)

## 2022-11-20 ENCOUNTER — Ambulatory Visit: Payer: BC Managed Care – PPO

## 2022-11-20 DIAGNOSIS — Z9189 Other specified personal risk factors, not elsewhere classified: Secondary | ICD-10-CM

## 2022-11-20 DIAGNOSIS — G4719 Other hypersomnia: Secondary | ICD-10-CM

## 2022-11-20 DIAGNOSIS — F17200 Nicotine dependence, unspecified, uncomplicated: Secondary | ICD-10-CM

## 2022-11-20 DIAGNOSIS — G4733 Obstructive sleep apnea (adult) (pediatric): Secondary | ICD-10-CM

## 2022-11-20 DIAGNOSIS — R635 Abnormal weight gain: Secondary | ICD-10-CM

## 2022-11-20 DIAGNOSIS — E669 Obesity, unspecified: Secondary | ICD-10-CM

## 2022-11-20 DIAGNOSIS — R519 Headache, unspecified: Secondary | ICD-10-CM

## 2022-11-20 DIAGNOSIS — R0683 Snoring: Secondary | ICD-10-CM

## 2022-11-20 DIAGNOSIS — J449 Chronic obstructive pulmonary disease, unspecified: Secondary | ICD-10-CM

## 2022-11-21 DIAGNOSIS — L600 Ingrowing nail: Secondary | ICD-10-CM | POA: Diagnosis not present

## 2022-11-21 DIAGNOSIS — L03031 Cellulitis of right toe: Secondary | ICD-10-CM | POA: Diagnosis not present

## 2022-11-27 NOTE — Progress Notes (Signed)
See procedure note.

## 2022-11-27 NOTE — Procedures (Signed)
   [facility]  HOME SLEEP TEST (Watch PAT) REPORT - Mail out device  STUDY DATE: 11/26/2022  DOB: 1980/10/22  MRN: [id number]  ORDERING CLINICIAN: Huston Foley, MD, PhD   REFERRING CLINICIAN: Gabriel Earing, FNP   CLINICAL INFORMATION/HISTORY: 42 year old female with an underlying medical history of vitamin B12 deficiency, vitamin D deficiency, arthritis, migraine headaches, COPD, heavy smoking, restless leg syndrome, anxiety, depression, and obesity, who reports snoring and excessive daytime somnolence, as well as waking up with a sense of gasping.   Epworth sleepiness score: 17/24.  BMI: 37 kg/m  FINDINGS:   Sleep Summary:   Total Recording Time (hours, min): 6 hours, 0 min  Total Sleep Time (hours, min):  4 hours, 33 min  Percent REM (%):    17.7%   Respiratory Indices:   Calculated pAHI (per hour):  3.6/hour         REM pAHI:    9.9/hour       NREM pAHI: 2.1/hour  Central pAHI: 0/hour  Oxygen Saturation Statistics:    Oxygen Saturation (%) Mean: 94%   Minimum oxygen saturation (%):                 86%   O2 Saturation Range (%): 86 - 98%    O2 Saturation (minutes) <=88%: 0 min  Pulse Rate Statistics:   Pulse Mean (bpm):    64/min    Pulse Range (49 - 104/min)   IMPRESSION: Primary snoring   RECOMMENDATION:  This home sleep test does not demonstrate any significant obstructive or central sleep disordered breathing with a total AHI of less than 5/hour. Her total AHI was 3.6/hour, O2 nadir of 86%.  Snoring was detected, and was intermittent, in the mild to - at times - moderate range. Treatment with a positive airway pressure device such as AutoPap or CPAP is not indicated based on this test. Snoring may improve with avoidance of the supine sleep position and weight loss (where clinically appropriate).  For disturbing snoring, an oral appliance through dentistry or orthodontics can be considered.  Weight loss and smoking cessation are  encouraged. Other causes of the patient's symptoms, including circadian rhythm disturbances, an underlying mood disorder, medication effect and/or an underlying medical problem cannot be ruled out based on this test. Clinical correlation is recommended.  The patient should be cautioned not to drive, work at heights, or operate dangerous or heavy equipment when tired or sleepy. Review and reiteration of good sleep hygiene measures should be pursued with any patient. The patient will be advised to follow up with her referring provider, who will be notified of the test results.   I certify that I have reviewed the raw data recording prior to the issuance of this report in accordance with the standards of the American Academy of Sleep Medicine (AASM).  INTERPRETING PHYSICIAN:   Huston Foley, MD, PhD Medical Director, [facility]) Diplomat, ABPN (Neurology and Sleep)   [facility] [address], [address] [place], [place] [address] [phone]

## 2022-11-28 ENCOUNTER — Other Ambulatory Visit: Payer: Self-pay | Admitting: Family Medicine

## 2022-11-28 DIAGNOSIS — E559 Vitamin D deficiency, unspecified: Secondary | ICD-10-CM

## 2022-12-05 ENCOUNTER — Ambulatory Visit: Payer: BC Managed Care – PPO | Admitting: Family Medicine

## 2022-12-05 ENCOUNTER — Other Ambulatory Visit: Payer: Self-pay | Admitting: Family Medicine

## 2022-12-05 DIAGNOSIS — G2581 Restless legs syndrome: Secondary | ICD-10-CM

## 2022-12-06 ENCOUNTER — Encounter: Payer: Self-pay | Admitting: Family Medicine

## 2022-12-06 ENCOUNTER — Ambulatory Visit: Payer: BC Managed Care – PPO | Admitting: Family Medicine

## 2022-12-06 VITALS — BP 105/72 | HR 69 | Temp 97.8°F | Ht 68.0 in | Wt 239.4 lb

## 2022-12-06 DIAGNOSIS — F411 Generalized anxiety disorder: Secondary | ICD-10-CM

## 2022-12-06 DIAGNOSIS — G43E09 Chronic migraine with aura, not intractable, without status migrainosus: Secondary | ICD-10-CM

## 2022-12-06 DIAGNOSIS — M357 Hypermobility syndrome: Secondary | ICD-10-CM

## 2022-12-06 DIAGNOSIS — Z23 Encounter for immunization: Secondary | ICD-10-CM

## 2022-12-06 DIAGNOSIS — K219 Gastro-esophageal reflux disease without esophagitis: Secondary | ICD-10-CM

## 2022-12-06 DIAGNOSIS — F4310 Post-traumatic stress disorder, unspecified: Secondary | ICD-10-CM

## 2022-12-06 DIAGNOSIS — F3341 Major depressive disorder, recurrent, in partial remission: Secondary | ICD-10-CM | POA: Diagnosis not present

## 2022-12-06 DIAGNOSIS — R5382 Chronic fatigue, unspecified: Secondary | ICD-10-CM

## 2022-12-06 MED ORDER — GABAPENTIN 400 MG PO CAPS
800.0000 mg | ORAL_CAPSULE | Freq: Every day | ORAL | 1 refills | Status: DC
Start: 2022-12-06 — End: 2023-05-30

## 2022-12-06 MED ORDER — OMEPRAZOLE 20 MG PO CPDR
20.0000 mg | DELAYED_RELEASE_CAPSULE | Freq: Every day | ORAL | 0 refills | Status: DC
Start: 2022-12-06 — End: 2023-08-06

## 2022-12-06 NOTE — Progress Notes (Signed)
Established Patient Office Visit  Subjective   Patient ID: Cynthia Hayden, female    DOB: [birth year]  Age: 42 y.o. MRN: [id number]  Chief Complaint  Patient presents with   Medical Management of Chronic Issues   Depression   Gastroesophageal Reflux    HPI  1. Fatigue Continues to report significant fatigue despite sleeping well at night. She had a sleep study and does not have OSA. Reports some days she feels ok and others she struggles to stay awake 30 mins to 1 hour after waking up. She is struggling to keep up with her house work. When she does do house work, it takes her longer to recuperate. She has occasional dizziness with standing, but not regularly. Denies palpations. Hx of hypermobility joint disorder. She has had a normal EKG, negative orthostatics, and negative poor man's test for POTS.   2. Acid reflux Increased belcing that is sour. Denies pain, nauesa, vomiting. She has started on OTC prilosec with improvement. She has been taking this for 5 days now.   3. Migraines  Compliant with topamax. Has 1-2 migraines a week typically- taking imitrex prn with relief.   4. Anxiety and depression Improving. Fairly compliant with regimen.   5. Folate/iron/b12 deficiency Complaint with daily supplements.      12/06/2022    3:01 PM 09/05/2022    2:28 PM 08/07/2022    2:31 PM  Depression screen PHQ 2/9  Decreased Interest 1 2 1   Down, Depressed, Hopeless 1 2 1   PHQ - 2 Score 2 4 2   Altered sleeping 3 3 3   Tired, decreased energy 3 3 3   Change in appetite 0 0 1  Feeling bad or failure about yourself  0 1 0  Trouble concentrating 2 3 2   Moving slowly or fidgety/restless 0 0 0  Suicidal thoughts 0 0 0  PHQ-9 Score 10 14 11   Difficult doing work/chores Very difficult Extremely dIfficult Somewhat difficult      12/06/2022    3:01 PM 09/05/2022    2:29 PM 08/07/2022    2:32 PM 06/06/2022   11:48 AM  GAD 7 : Generalized Anxiety Score  Nervous, Anxious, on Edge 1 2 1  0   Control/stop worrying 1 1 1 1   Worry too much - different things 1 1 1 1   Trouble relaxing 0 0 0 2  Restless 2 0 0 2  Easily annoyed or irritable 2 3 2 3   Afraid - awful might happen 0 0 0 1  Total GAD 7 Score 7 7 5 10   Anxiety Difficulty Somewhat difficult Somewhat difficult Somewhat difficult Somewhat difficult       ROS As per HPI.    Objective:     BP 105/72   Pulse 69   Temp 97.8 F (36.6 C) (Temporal)   Ht 5\' 8"  (1.727 m)   Wt 239 lb 6 oz (108.6 kg)   SpO2 97%   BMI 36.40 kg/m    Physical Exam Vitals and nursing note reviewed.  Constitutional:      General: She is not in acute distress.    Appearance: She is obese. She is not ill-appearing, toxic-appearing or diaphoretic.  Cardiovascular:     Rate and Rhythm: Normal rate and regular rhythm.     Heart sounds: Normal heart sounds. No murmur heard. Pulmonary:     Effort: Pulmonary effort is normal. No respiratory distress.     Breath sounds: Normal breath sounds.  Musculoskeletal:     Cervical back:  Neck supple. No rigidity.     Right lower leg: No edema.     Left lower leg: No edema.  Skin:    General: Skin is warm and dry.  Neurological:     General: No focal deficit present.     Mental Status: She is alert and oriented to person, place, and time.  Psychiatric:        Mood and Affect: Mood normal.        Behavior: Behavior normal.      No results found for any visits on 12/06/22.    The ASCVD Risk score (Arnett DK, et al., 2019) failed to calculate for the following reasons:   The 2019 ASCVD risk score is only valid for ages 56 to 61    Assessment & Plan:   Cynthia Hayden was seen today for medical management of chronic issues, depression and gastroesophageal reflux.  Diagnoses and all orders for this visit:  Generalized anxiety disorder Recurrent major depressive disorder, in partial remission (HCC) PTSD (post-traumatic stress disorder) Stable. Denies SI. Primarily reports symptoms of fatigue.  Discussed compliance with medications. Has declined referrals.   Chronic fatigue ? CFS vs depression. Recommend activity and symtpom diary.   Chronic migraine with aura without status migrainosus, not intractable Well controlled on current regimen.   Gastroesophageal reflux disease without esophagitis Continue omeprazole for at least 6 weeks.  -     omeprazole (PRILOSEC) 20 MG capsule; Take 1 capsule (20 mg total) by mouth daily.  Benign joint hypermobility syndrome -     gabapentin (NEURONTIN) 400 MG capsule; Take 2 capsules (800 mg total) by mouth at bedtime.  Need for immunization against influenza -     Flu vaccine trivalent PF, 6mos and older(Flulaval,Afluria,Fluarix,Fluzone)   Return in about 6 months (around 06/06/2023) for CPE. Sooner for new or worsening symptoms.   The patient indicates understanding of these issues and agrees with the plan.  Gabriel Earing, FNP

## 2022-12-10 DIAGNOSIS — M79675 Pain in left toe(s): Secondary | ICD-10-CM | POA: Diagnosis not present

## 2022-12-10 DIAGNOSIS — L03032 Cellulitis of left toe: Secondary | ICD-10-CM | POA: Diagnosis not present

## 2022-12-18 ENCOUNTER — Other Ambulatory Visit: Payer: Self-pay | Admitting: Family Medicine

## 2022-12-18 DIAGNOSIS — E538 Deficiency of other specified B group vitamins: Secondary | ICD-10-CM

## 2023-01-08 ENCOUNTER — Encounter: Payer: Self-pay | Admitting: Family Medicine

## 2023-01-10 ENCOUNTER — Other Ambulatory Visit: Payer: Self-pay | Admitting: Family Medicine

## 2023-01-10 DIAGNOSIS — K219 Gastro-esophageal reflux disease without esophagitis: Secondary | ICD-10-CM

## 2023-01-29 ENCOUNTER — Other Ambulatory Visit: Payer: Self-pay | Admitting: Family Medicine

## 2023-01-29 DIAGNOSIS — G43801 Other migraine, not intractable, with status migrainosus: Secondary | ICD-10-CM

## 2023-01-29 DIAGNOSIS — G2581 Restless legs syndrome: Secondary | ICD-10-CM

## 2023-02-28 ENCOUNTER — Other Ambulatory Visit: Payer: Self-pay | Admitting: Family Medicine

## 2023-02-28 DIAGNOSIS — G43801 Other migraine, not intractable, with status migrainosus: Secondary | ICD-10-CM

## 2023-03-07 ENCOUNTER — Other Ambulatory Visit: Payer: Self-pay | Admitting: Family Medicine

## 2023-03-07 ENCOUNTER — Other Ambulatory Visit: Payer: Self-pay | Admitting: Nurse Practitioner

## 2023-03-07 DIAGNOSIS — E559 Vitamin D deficiency, unspecified: Secondary | ICD-10-CM

## 2023-03-07 DIAGNOSIS — E538 Deficiency of other specified B group vitamins: Secondary | ICD-10-CM

## 2023-03-07 NOTE — Telephone Encounter (Signed)
 Tiffany NTBS in April for CPE RF sent to pharmacy

## 2023-03-10 NOTE — Telephone Encounter (Signed)
 Appt scheduled for 06/12/2023

## 2023-03-19 ENCOUNTER — Other Ambulatory Visit: Payer: Self-pay | Admitting: Family Medicine

## 2023-03-19 DIAGNOSIS — F3341 Major depressive disorder, recurrent, in partial remission: Secondary | ICD-10-CM

## 2023-03-19 DIAGNOSIS — F411 Generalized anxiety disorder: Secondary | ICD-10-CM

## 2023-04-29 ENCOUNTER — Other Ambulatory Visit: Payer: Self-pay | Admitting: Family Medicine

## 2023-04-29 DIAGNOSIS — G43801 Other migraine, not intractable, with status migrainosus: Secondary | ICD-10-CM

## 2023-05-30 ENCOUNTER — Other Ambulatory Visit: Payer: Self-pay | Admitting: Family Medicine

## 2023-05-30 DIAGNOSIS — M357 Hypermobility syndrome: Secondary | ICD-10-CM

## 2023-06-12 ENCOUNTER — Encounter: Payer: Self-pay | Admitting: Family Medicine

## 2023-06-12 ENCOUNTER — Ambulatory Visit (INDEPENDENT_AMBULATORY_CARE_PROVIDER_SITE_OTHER): Payer: BC Managed Care – PPO | Admitting: Family Medicine

## 2023-06-12 VITALS — BP 108/74 | HR 69 | Temp 97.4°F | Ht 68.0 in | Wt 242.0 lb

## 2023-06-12 DIAGNOSIS — Z124 Encounter for screening for malignant neoplasm of cervix: Secondary | ICD-10-CM

## 2023-06-12 DIAGNOSIS — E538 Deficiency of other specified B group vitamins: Secondary | ICD-10-CM

## 2023-06-12 DIAGNOSIS — G2581 Restless legs syndrome: Secondary | ICD-10-CM

## 2023-06-12 DIAGNOSIS — R6889 Other general symptoms and signs: Secondary | ICD-10-CM | POA: Diagnosis not present

## 2023-06-12 DIAGNOSIS — R5382 Chronic fatigue, unspecified: Secondary | ICD-10-CM | POA: Diagnosis not present

## 2023-06-12 DIAGNOSIS — Z0001 Encounter for general adult medical examination with abnormal findings: Secondary | ICD-10-CM

## 2023-06-12 DIAGNOSIS — F3341 Major depressive disorder, recurrent, in partial remission: Secondary | ICD-10-CM

## 2023-06-12 DIAGNOSIS — F411 Generalized anxiety disorder: Secondary | ICD-10-CM | POA: Diagnosis not present

## 2023-06-12 DIAGNOSIS — M357 Hypermobility syndrome: Secondary | ICD-10-CM | POA: Diagnosis not present

## 2023-06-12 DIAGNOSIS — E559 Vitamin D deficiency, unspecified: Secondary | ICD-10-CM

## 2023-06-12 DIAGNOSIS — Z0000 Encounter for general adult medical examination without abnormal findings: Secondary | ICD-10-CM | POA: Diagnosis not present

## 2023-06-12 DIAGNOSIS — G43801 Other migraine, not intractable, with status migrainosus: Secondary | ICD-10-CM

## 2023-06-12 LAB — BAYER DCA HB A1C WAIVED: HB A1C (BAYER DCA - WAIVED): 4.9 % (ref 4.8–5.6)

## 2023-06-12 MED ORDER — PRAMIPEXOLE DIHYDROCHLORIDE 1 MG PO TABS: 1.0000 mg | ORAL_TABLET | Freq: Three times a day (TID) | ORAL | 2 refills | Status: DC

## 2023-06-12 MED ORDER — FOLIC ACID 1 MG PO TABS: 1.0000 mg | ORAL_TABLET | Freq: Every day | ORAL | 3 refills | Status: AC

## 2023-06-12 MED ORDER — GABAPENTIN 400 MG PO CAPS: 800.0000 mg | ORAL_CAPSULE | Freq: Every day | ORAL | 3 refills | Status: AC

## 2023-06-12 MED ORDER — TOPIRAMATE 100 MG PO TABS: ORAL_TABLET | ORAL | 0 refills | Status: DC

## 2023-06-12 MED ORDER — CITALOPRAM HYDROBROMIDE 40 MG PO TABS: 40.0000 mg | ORAL_TABLET | Freq: Every day | ORAL | 3 refills | Status: AC

## 2023-06-12 MED ORDER — SUMATRIPTAN SUCCINATE 100 MG PO TABS: ORAL_TABLET | ORAL | 1 refills | Status: AC

## 2023-06-12 MED ORDER — VITAMIN B-12 1000 MCG PO TABS: 1000.0000 ug | ORAL_TABLET | Freq: Every day | ORAL | 3 refills | Status: DC

## 2023-06-12 NOTE — Patient Instructions (Addendum)
 App on phone called Visible.    Health Maintenance, Female Adopting a healthy lifestyle and getting preventive care are important in promoting health and wellness. Ask your health care provider about: The right schedule for you to have regular tests and exams. Things you can do on your own to prevent diseases and keep yourself healthy. What should I know about diet, weight, and exercise? Eat a healthy diet  Eat a diet that includes plenty of vegetables, fruits, low-fat dairy products, and lean protein. Do not eat a lot of foods that are high in solid fats, added sugars, or sodium. Maintain a healthy weight Body mass index (BMI) is used to identify weight problems. It estimates body fat based on height and weight. Your health care provider can help determine your BMI and help you achieve or maintain a healthy weight. Get regular exercise Get regular exercise. This is one of the most important things you can do for your health. Most adults should: Exercise for at least 150 minutes each week. The exercise should increase your heart rate and make you sweat (moderate-intensity exercise). Do strengthening exercises at least twice a week. This is in addition to the moderate-intensity exercise. Spend less time sitting. Even light physical activity can be beneficial. Watch cholesterol and blood lipids Have your blood tested for lipids and cholesterol at 41 years of age, then have this test every 5 years. Have your cholesterol levels checked more often if: Your lipid or cholesterol levels are high. You are older than 41 years of age. You are at high risk for heart disease. What should I know about cancer screening? Depending on your health history and family history, you may need to have cancer screening at various ages. This may include screening for: Breast cancer. Cervical cancer. Colorectal cancer. Skin cancer. Lung cancer. What should I know about heart disease, diabetes, and high blood  pressure? Blood pressure and heart disease High blood pressure causes heart disease and increases the risk of stroke. This is more likely to develop in people who have high blood pressure readings or are overweight. Have your blood pressure checked: Every 3-5 years if you are 45-62 years of age. Every year if you are 62 years old or older. Diabetes Have regular diabetes screenings. This checks your fasting blood sugar level. Have the screening done: Once every three years after age 72 if you are at a normal weight and have a low risk for diabetes. More often and at a younger age if you are overweight or have a high risk for diabetes. What should I know about preventing infection? Hepatitis B If you have a higher risk for hepatitis B, you should be screened for this virus. Talk with your health care provider to find out if you are at risk for hepatitis B infection. Hepatitis C Testing is recommended for: Everyone born from [birth year] through 1963. Anyone with known risk factors for hepatitis C. Sexually transmitted infections (STIs) Get screened for STIs, including gonorrhea and chlamydia, if: You are sexually active and are younger than 41 years of age. You are older than 41 years of age and your health care provider tells you that you are at risk for this type of infection. Your sexual activity has changed since you were last screened, and you are at increased risk for chlamydia or gonorrhea. Ask your health care provider if you are at risk. Ask your health care provider about whether you are at high risk for HIV. Your health care provider may  recommend a prescription medicine to help prevent HIV infection. If you choose to take medicine to prevent HIV, you should first get tested for HIV. You should then be tested every 3 months for as long as you are taking the medicine. Pregnancy If you are about to stop having your period (premenopausal) and you may become pregnant, seek counseling before you  get pregnant. Take 400 to 800 micrograms (mcg) of folic acid every day if you become pregnant. Ask for birth control (contraception) if you want to prevent pregnancy. Osteoporosis and menopause Osteoporosis is a disease in which the bones lose minerals and strength with aging. This can result in bone fractures. If you are 46 years old or older, or if you are at risk for osteoporosis and fractures, ask your health care provider if you should: Be screened for bone loss. Take a calcium or vitamin D supplement to lower your risk of fractures. Be given hormone replacement therapy (HRT) to treat symptoms of menopause. Follow these instructions at home: Alcohol use Do not drink alcohol if: Your health care provider tells you not to drink. You are pregnant, may be pregnant, or are planning to become pregnant. If you drink alcohol: Limit how much you have to: 0-1 drink a day. Know how much alcohol is in your drink. In the [place] one drink equals one 12 oz bottle of beer (355 mL), one 5 oz glass of wine (148 mL), or one 1 oz glass of hard liquor (44 mL). Lifestyle Do not use any products that contain nicotine or tobacco. These products include cigarettes, chewing tobacco, and vaping devices, such as e-cigarettes. If you need help quitting, ask your health care provider. Do not use street drugs. Do not share needles. Ask your health care provider for help if you need support or information about quitting drugs. General instructions Schedule regular health, dental, and eye exams. Stay current with your vaccines. Tell your health care provider if: You often feel depressed. You have ever been abused or do not feel safe at home. Summary Adopting a healthy lifestyle and getting preventive care are important in promoting health and wellness. Follow your health care provider's instructions about healthy diet, exercising, and getting tested or screened for diseases. Follow your health care provider's  instructions on monitoring your cholesterol and blood pressure. This information is not intended to replace advice given to you by your health care provider. Make sure you discuss any questions you have with your health care provider. Document Revised: 07/03/2020 Document Reviewed: 07/03/2020 Elsevier Patient Education  2024 [employer].

## 2023-06-12 NOTE — Progress Notes (Signed)
 Complete physical exam  Patient: Cynthia Hayden   DOB: Nov 29, 1980   43 y.o. Female  MRN: [id number]  Subjective:    Chief Complaint  Patient presents with   Annual Exam    No pap     Cynthia Hayden is a 43 y.o. female who presents today for a complete physical exam. She reports consuming a general diet. The patient does not participate in regular exercise at present. She generally feels fairly well. She reports sleeping well. She does have additional problems to discuss today.   Having brain fog lately, feeling very forgetful. She is sleeping well. Fatigue has been a little better. She is trying to reduce her napping. Sometimes fatigue is significant enough to force her to lay down. Feels better after a rest. Had sleep study which did not show OSA. Still having a lot of joint pain but manageable. Has dizzy spells where she feels off balance that lasts for just a few seconds. If she increases her activity, she feels worse the following day or days. Also notices chills on the bad days.   Migraines have been well controlled.   Anxiety and depression is stable.   Most recent fall risk assessment:    06/12/2023    9:56 AM  Fall Risk   Falls in the past year? 0     Most recent depression screenings:    06/12/2023    9:56 AM 12/06/2022    3:01 PM 09/05/2022    2:28 PM  Depression screen PHQ 2/9  Decreased Interest 1 1 2   Down, Depressed, Hopeless 1 1 2   PHQ - 2 Score 2 2 4   Altered sleeping 1 3 3   Tired, decreased energy 0 3 3  Change in appetite 2 0 0  Feeling bad or failure about yourself  0 0 1  Trouble concentrating 1 2 3   Moving slowly or fidgety/restless 0 0 0  Suicidal thoughts 0 0 0  PHQ-9 Score 6 10 14   Difficult doing work/chores Somewhat difficult Very difficult Extremely dIfficult      06/12/2023    9:57 AM 12/06/2022    3:01 PM 09/05/2022    2:29 PM 08/07/2022    2:32 PM  GAD 7 : Generalized Anxiety Score  Nervous, Anxious, on Edge 2 1 2 1   Control/stop  worrying 0 1 1 1   Worry too much - different things 0 1 1 1   Trouble relaxing 0 0 0 0  Restless 0 2 0 0  Easily annoyed or irritable 3 2 3 2   Afraid - awful might happen 2 0 0 0  Total GAD 7 Score 7 7 7 5   Anxiety Difficulty Somewhat difficult Somewhat difficult Somewhat difficult Somewhat difficult          Patient Care Team: Gabriel Earing, FNP as PCP - General (Family Medicine)   Outpatient Medications Prior to Visit  Medication Sig   citalopram (CELEXA) 40 MG tablet TAKE ONE (1) TABLET BY MOUTH EVERY DAY   cyanocobalamin (VITAMIN B12) 1000 MCG tablet Take 1 tablet (1,000 mcg total) by mouth daily.   folic acid (FOLVITE) 1 MG tablet TAKE ONE (1) TABLET BY MOUTH EVERY DAY   gabapentin (NEURONTIN) 400 MG capsule TAKE 2 CAPSULES BY MOUTH AT BEDTIME   omeprazole (PRILOSEC) 20 MG capsule Take 1 capsule (20 mg total) by mouth daily.   pramipexole (MIRAPEX) 1 MG tablet TAKE ONE (1) TABLET BY MOUTH 3 TIMES DAILY   SUMAtriptan (IMITREX) 100 MG tablet  TAKE 1 TABLET AT ONSET OF MIGRAINE HEADACHE MAY REPEAT ONCE IN 2 HOURS (MAX OF 2 TABLETS/24 HOURS)   topiramate (TOPAMAX) 100 MG tablet TAKE ONE (1) TABLET BY MOUTH TWO (2) TIMES DAILY   Vitamin D, Ergocalciferol, (DRISDOL) 1.25 MG (50000 UNIT) CAPS capsule TAKE 1 CAPSULE BY MOUTH EVERY 7 DAYS   No facility-administered medications prior to visit.    ROS Negative unless specially indicated above in HPI.      Objective:     BP 108/74   Pulse 69   Temp (!) 97.4 F (36.3 C) (Temporal)   Ht 5\' 8"  (1.727 m)   Wt 242 lb (109.8 kg)   SpO2 97%   BMI 36.80 kg/m    Physical Exam Vitals and nursing note reviewed.  Constitutional:      General: She is not in acute distress.    Appearance: She is obese. She is not ill-appearing, toxic-appearing or diaphoretic.  HENT:     Head: Normocephalic.     Right Ear: Tympanic membrane, ear canal and external ear normal.     Left Ear: Tympanic membrane, ear canal and external ear normal.      Nose: Nose normal.     Mouth/Throat:     Mouth: Mucous membranes are dry.     Pharynx: Oropharynx is clear.  Eyes:     Extraocular Movements: Extraocular movements intact.     Conjunctiva/sclera: Conjunctivae normal.     Pupils: Pupils are equal, round, and reactive to light.  Cardiovascular:     Rate and Rhythm: Normal rate and regular rhythm.     Pulses: Normal pulses.     Heart sounds: Normal heart sounds. No murmur heard.    No friction rub. No gallop.  Pulmonary:     Effort: Pulmonary effort is normal.     Breath sounds: Normal breath sounds.  Abdominal:     General: Bowel sounds are normal. There is no distension.     Palpations: Abdomen is soft. There is no mass.     Tenderness: There is no abdominal tenderness. There is no guarding.  Musculoskeletal:     Cervical back: Normal range of motion and neck supple. No tenderness.     Right lower leg: No edema.     Left lower leg: No edema.  Skin:    General: Skin is warm and dry.     Capillary Refill: Capillary refill takes less than 2 seconds.     Findings: No lesion or rash.  Neurological:     General: No focal deficit present.     Mental Status: She is alert and oriented to person, place, and time.     Cranial Nerves: No cranial nerve deficit.     Motor: No weakness.     Gait: Gait normal.  Psychiatric:        Mood and Affect: Mood normal.        Behavior: Behavior normal.        Thought Content: Thought content normal.        Judgment: Judgment normal.      No results found for any visits on 06/12/23.     Assessment & Plan:    Routine Health Maintenance and Physical Exam  Cynthia Hayden was seen today for annual exam.  Diagnoses and all orders for this visit:  Routine general medical examination at a health care facility -     Lipid panel  Chronic fatigue ? Chronic fatigue syndrome. Ongoing for >1 year with post  extertional malaise. Will check labs as below. Discussed symptom tracker app and pacing  activities.  -     CMP14+EGFR -     Thyroid Panel With TSH -     Bayer DCA Hb A1c Waived -     Sedimentation Rate -     ANA w/Reflex if Positive -     C-reactive protein  Benign joint hypermobility syndrome -     gabapentin (NEURONTIN) 400 MG capsule; Take 2 capsules (800 mg total) by mouth at bedtime.  Generalized anxiety disorder Recurrent major depressive disorder, in partial remission (HCC) Stable. Denies SI.  -     citalopram (CELEXA) 40 MG tablet; Take 1 tablet (40 mg total) by mouth daily.  B12 deficiency -     cyanocobalamin (VITAMIN B12) 1000 MCG tablet; Take 1 tablet (1,000 mcg total) by mouth daily. -     Anemia Profile B  Folate deficiency -     folic acid (FOLVITE) 1 MG tablet; Take 1 tablet (1 mg total) by mouth daily. -     Anemia Profile B  Vitamin D deficiency -     VITAMIN D 25 Hydroxy (Vit-D Deficiency, Fractures)  Restless legs Well controlled on current regimen.  -     pramipexole (MIRAPEX) 1 MG tablet; Take 1 tablet (1 mg total) by mouth 3 (three) times daily.  Other migraine with status migrainosus, not intractable Well controlled on current regimen.  -     topiramate (TOPAMAX) 100 MG tablet; TAKE ONE (1) TABLET BY MOUTH TWO (2) TIMES DAILY -     SUMAtriptan (IMITREX) 100 MG tablet; TAKE 1 TABLET AT ONSET OF MIGRAINE HEADACHE MAY REPEAT ONCE IN 2 HOURS (MAX OF 2 TABLETS/24 HOURS)  Cervical cancer screening Declined today. Scheduled for later day.    Immunization History  Administered Date(s) Administered   DTaP 04/22/1983, 07/01/1983, 08/26/1983, 09/07/1984, 07/06/1988   HIB (PRP-OMP) 06/15/1984   Hepatitis B 11/07/1994, 12/12/1994, 06/05/1995   IPV 04/22/1983, 08/26/1983, 09/07/1984, 06/06/1988   Influenza, Seasonal, Injecte, Preservative Fre 12/06/2022   Influenza,inj,Quad PF,6+ Mos 12/22/2015, 12/02/2016, 12/01/2017, 12/01/2018, 12/15/2019, 12/21/2021   Influenza-Unspecified 01/09/2021   MMR 06/15/1984   PFIZER(Purple Top)SARS-COV-2  Vaccination 09/23/2019, 10/15/2019   PNEUMOCOCCAL CONJUGATE-20 12/21/2021   Pneumococcal Polysaccharide-23 12/02/2016    Health Maintenance  Topic Date Due   HIV Screening  Never done   Hepatitis C Screening  Never done   COVID-19 Vaccine (3 - 2024-25 season) 12/22/2023 (Originally 10/27/2022)   INFLUENZA VACCINE  09/26/2023   Cervical Cancer Screening (HPV/Pap Cotest)  07/25/2025   Pneumococcal Vaccine 32-45 Years old  Completed   HPV VACCINES  Aged Out   Meningococcal B Vaccine  Aged Out   DTaP/Tdap/Td  Discontinued    Discussed health benefits of physical activity, and encouraged her to engage in regular exercise appropriate for her age and condition.  Problem List Items Addressed This Visit       Cardiovascular and Mediastinum   Migraine   Relevant Medications   citalopram (CELEXA) 40 MG tablet   gabapentin (NEURONTIN) 400 MG capsule   topiramate (TOPAMAX) 100 MG tablet   SUMAtriptan (IMITREX) 100 MG tablet     Musculoskeletal and Integument   Benign joint hypermobility syndrome   Relevant Medications   gabapentin (NEURONTIN) 400 MG capsule     Other   Restless legs   Relevant Medications   pramipexole (MIRAPEX) 1 MG tablet   Recurrent major depressive disorder, in partial remission (HCC)   Relevant Medications  citalopram (CELEXA) 40 MG tablet   Generalized anxiety disorder   Relevant Medications   citalopram (CELEXA) 40 MG tablet   B12 deficiency   Relevant Medications   cyanocobalamin (VITAMIN B12) 1000 MCG tablet   folic acid (FOLVITE) 1 MG tablet   Other Relevant Orders   Anemia Profile B   Vitamin D deficiency   Relevant Orders   VITAMIN D 25 Hydroxy (Vit-D Deficiency, Fractures)   Chronic fatigue   Relevant Orders   CMP14+EGFR   Thyroid Panel With TSH   Bayer DCA Hb A1c Waived   Sedimentation Rate   ANA w/Reflex if Positive   C-reactive protein   Other Visit Diagnoses       Routine general medical examination at a health care facility    -   Primary   Relevant Orders   Lipid panel     Cervical cancer screening          Return in about 3 months (around 09/11/2023) for chronic follow up with PAP.   The patient indicates understanding of these issues and agrees with the plan.  Albertha Huger, FNP

## 2023-06-13 ENCOUNTER — Encounter: Payer: Self-pay | Admitting: Family Medicine

## 2023-06-13 LAB — CMP14+EGFR
ALT: 17 IU/L (ref 0–32)
AST: 12 IU/L (ref 0–40)
Albumin: 4.1 g/dL (ref 3.9–4.9)
Alkaline Phosphatase: 86 IU/L (ref 44–121)
BUN/Creatinine Ratio: 16 (ref 9–23)
BUN: 13 mg/dL (ref 6–24)
Bilirubin Total: <0.2 mg/dL (ref 0.0–1.2)
CO2: 16 mmol/L — ABNORMAL LOW (ref 20–29)
Calcium: 9 mg/dL (ref 8.7–10.2)
Chloride: 104 mmol/L (ref 96–106)
Creatinine, Ser: 0.81 mg/dL (ref 0.57–1.00)
Globulin, Total: 2.6 g/dL (ref 1.5–4.5)
Glucose: 88 mg/dL (ref 70–99)
Potassium: 4.6 mmol/L (ref 3.5–5.2)
Sodium: 141 mmol/L (ref 134–144)
Total Protein: 6.7 g/dL (ref 6.0–8.5)
eGFR: 94 mL/min/{1.73_m2} (ref >59)

## 2023-06-13 LAB — ANEMIA PROFILE B
Basophils Absolute: 0.1 10*3/uL (ref 0.0–0.2)
Basos: 1 %
EOS (ABSOLUTE): 0.1 10*3/uL (ref 0.0–0.4)
Eos: 1 %
Ferritin: 66 ng/mL (ref 15–150)
Folate: 16.7 ng/mL (ref >3.0)
Hematocrit: 44.6 % (ref 34.0–46.6)
Hemoglobin: 14.8 g/dL (ref 11.1–15.9)
Immature Grans (Abs): 0 10*3/uL (ref 0.0–0.1)
Immature Granulocytes: 0 %
Iron Saturation: 17 % (ref 15–55)
Iron: 58 ug/dL (ref 27–159)
Lymphocytes Absolute: 2.3 10*3/uL (ref 0.7–3.1)
Lymphs: 30 %
MCH: 31.7 pg (ref 26.6–33.0)
MCHC: 33.2 g/dL (ref 31.5–35.7)
MCV: 96 fL (ref 79–97)
Monocytes Absolute: 0.5 10*3/uL (ref 0.1–0.9)
Monocytes: 6 %
Neutrophils Absolute: 4.9 10*3/uL (ref 1.4–7.0)
Neutrophils: 62 %
Platelets: 252 10*3/uL (ref 150–450)
RBC: 4.67 x10E6/uL (ref 3.77–5.28)
RDW: 12.8 % (ref 11.7–15.4)
Retic Ct Pct: 1.3 % (ref 0.6–2.6)
Total Iron Binding Capacity: 337 ug/dL (ref 250–450)
UIBC: 279 ug/dL (ref 131–425)
Vitamin B-12: 221 pg/mL — ABNORMAL LOW (ref 232–1245)
WBC: 7.9 10*3/uL (ref 3.4–10.8)

## 2023-06-13 LAB — LIPID PANEL
Chol/HDL Ratio: 4.6 ratio — ABNORMAL HIGH (ref 0.0–4.4)
Cholesterol, Total: 223 mg/dL — ABNORMAL HIGH (ref 100–199)
HDL: 49 mg/dL (ref >39)
LDL Chol Calc (NIH): 138 mg/dL — ABNORMAL HIGH (ref 0–99)
Triglycerides: 202 mg/dL — ABNORMAL HIGH (ref 0–149)
VLDL Cholesterol Cal: 36 mg/dL (ref 5–40)

## 2023-06-13 LAB — THYROID PANEL WITH TSH
Free Thyroxine Index: 1.6 (ref 1.2–4.9)
T3 Uptake Ratio: 21 % — ABNORMAL LOW (ref 24–39)
T4, Total: 7.6 ug/dL (ref 4.5–12.0)
TSH: 3.65 u[IU]/mL (ref 0.450–4.500)

## 2023-06-13 LAB — C-REACTIVE PROTEIN: CRP: 2 mg/L (ref 0–10)

## 2023-06-13 LAB — VITAMIN D 25 HYDROXY (VIT D DEFICIENCY, FRACTURES): Vit D, 25-Hydroxy: 33.2 ng/mL (ref 30.0–100.0)

## 2023-06-13 LAB — ANA W/REFLEX IF POSITIVE: Anti Nuclear Antibody (ANA): NEGATIVE

## 2023-06-13 LAB — SEDIMENTATION RATE: Sed Rate: 19 mm/h (ref 0–32)

## 2023-06-24 ENCOUNTER — Ambulatory Visit (INDEPENDENT_AMBULATORY_CARE_PROVIDER_SITE_OTHER)

## 2023-06-24 DIAGNOSIS — E538 Deficiency of other specified B group vitamins: Secondary | ICD-10-CM | POA: Diagnosis not present

## 2023-06-24 MED ORDER — CYANOCOBALAMIN 1000 MCG/ML IJ SOLN
1000.0000 ug | Freq: Once | INTRAMUSCULAR | Status: AC
  Administered 2023-06-24: 1000 ug via INTRAMUSCULAR

## 2023-06-24 MED ORDER — CYANOCOBALAMIN 1000 MCG/ML IJ SOLN: INTRAMUSCULAR | 0 refills | Status: AC

## 2023-06-24 NOTE — Progress Notes (Signed)
 Patient is in office today for a nurse visit for B12 Injection. Patient Injection was given in the  Left deltoid. Patient tolerated injection well.

## 2023-08-06 ENCOUNTER — Ambulatory Visit (INDEPENDENT_AMBULATORY_CARE_PROVIDER_SITE_OTHER)

## 2023-08-06 ENCOUNTER — Ambulatory Visit: Admitting: Family Medicine

## 2023-08-06 ENCOUNTER — Encounter: Payer: Self-pay | Admitting: Family Medicine

## 2023-08-06 VITALS — BP 106/66 | HR 67 | Temp 98.5°F | Ht 68.0 in | Wt 239.6 lb

## 2023-08-06 DIAGNOSIS — G8929 Other chronic pain: Secondary | ICD-10-CM | POA: Diagnosis not present

## 2023-08-06 DIAGNOSIS — M25561 Pain in right knee: Secondary | ICD-10-CM

## 2023-08-06 DIAGNOSIS — F17200 Nicotine dependence, unspecified, uncomplicated: Secondary | ICD-10-CM

## 2023-08-06 DIAGNOSIS — L819 Disorder of pigmentation, unspecified: Secondary | ICD-10-CM

## 2023-08-06 DIAGNOSIS — M25562 Pain in left knee: Secondary | ICD-10-CM

## 2023-08-06 DIAGNOSIS — M357 Hypermobility syndrome: Secondary | ICD-10-CM

## 2023-08-06 DIAGNOSIS — R231 Pallor: Secondary | ICD-10-CM

## 2023-08-06 MED ORDER — MELOXICAM 15 MG PO TABS: 15.0000 mg | ORAL_TABLET | Freq: Every day | ORAL | 0 refills | Status: DC

## 2023-08-06 NOTE — Progress Notes (Signed)
 Acute Office Visit  Subjective:     Patient ID: Cynthia Hayden, female    DOB: [birth year], 43 y.o.   MRN: [id number]  Chief Complaint  Patient presents with   Knee Pain    HPI Patient is in today for bilateral knee pain for at last year or so. This has been intermittent, gradually worsening. Right knee is the most bothersome right now, in past, the left knee has been the worst. Pain is at the top of knee. Pain is aggravated by going down steps and going from a seated to standing position. Has popping often. Feels like both of her knees buckle intermittently and nearly causes her to fall. Denies numbness or tingling, stiffness, erythema, swelling, injury. Rest helps symptoms. Dx of benign hypermobility joint syndrome. Cannot recall where when she was dx with this.   Purple color to both lower legs and feet. This is worse if she is standing for awhile or in the shower. She has numbness/tingling to her left heel. This is chronic and unchanged.  She saw vascular in 2020 and had normal ABIs. She is a smoker. Denies pain in her legs.  ROS As per HPI.      Objective:    BP 106/66   Pulse 67   Temp 98.5 F (36.9 C) (Temporal)   Ht 5' 8 (1.727 m)   Wt 239 lb 9.6 oz (108.7 kg)   SpO2 95%   BMI 36.43 kg/m    Physical Exam Vitals and nursing note reviewed.  Constitutional:      General: She is not in acute distress.    Appearance: She is not ill-appearing, toxic-appearing or diaphoretic.  Musculoskeletal:     Right knee: No swelling, deformity, effusion, erythema or bony tenderness. Normal range of motion. No tenderness. Normal patellar mobility.     Instability Tests: Medial McMurray test negative and lateral McMurray test negative.     Left knee: No swelling, deformity, effusion, erythema or bony tenderness. Normal range of motion. No tenderness. Normal patellar mobility.     Instability Tests: Medial McMurray test negative and lateral McMurray test negative.     Right lower leg:  No edema.     Left lower leg: No edema.  Skin:    General: Skin is warm and dry.     Comments: Erven Heater mottled pattern to bilateral lower legs with purple-blue discoloration. See picture below.   Neurological:     General: No focal deficit present.     Mental Status: She is alert and oriented to person, place, and time.  Psychiatric:        Mood and Affect: Mood normal.        Behavior: Behavior normal.       No results found for any visits on 08/06/23.      Assessment & Plan:   Cynthia Hayden was seen today for knee pain.  Diagnoses and all orders for this visit:  Bilateral chronic knee pain Xrays today in office. Radiology reads pending. Discussed referral to ortho pending reports as she reports giving out of knees intermittently. Mobic  prn. Also discussed bracing, rest, elevation.  -     DG Knee 1-2 Views Left; Future -     DG Knee 1-2 Views Right; Future -     meloxicam  (MOBIC ) 15 MG tablet; Take 1 tablet (15 mg total) by mouth daily.  Benign joint hypermobility syndrome  Livedo reticularis Discoloration of skin of lower leg Smoker Referral to vascular discussed and placed.  Reschedule chronic follow up.   The patient indicates understanding of these issues and agrees with the plan.  Total time spent caring for the patient today was 38 minutes. This includes time spent before the visit reviewing the chart, time spent during the visit, and time spent after the visit on documentation.  Albertha Huger, FNP

## 2023-08-08 ENCOUNTER — Encounter: Payer: Self-pay | Admitting: Family Medicine

## 2023-08-11 ENCOUNTER — Ambulatory Visit: Payer: Self-pay | Admitting: Family Medicine

## 2023-08-11 DIAGNOSIS — G8929 Other chronic pain: Secondary | ICD-10-CM

## 2023-08-21 ENCOUNTER — Telehealth: Payer: Self-pay | Admitting: Family Medicine

## 2023-08-21 ENCOUNTER — Encounter: Payer: Self-pay | Admitting: Family Medicine

## 2023-08-21 NOTE — Telephone Encounter (Signed)
 Copied from CRM [id number]. Topic: Referral - Question >> Aug 21, 2023  9:49 AM Kevelyn M wrote: Reason for CRM: Patient called in and stated that the name needs to be changed to [facility] for her referral. Please respond through [facility] and let the patient know when this is done.

## 2023-08-21 NOTE — Telephone Encounter (Signed)
This has been addressed in MyChart message, will close encounter.

## 2023-08-21 NOTE — Telephone Encounter (Signed)
 Referral's have been sent to [facility]) as Patient requested.

## 2023-08-21 NOTE — Telephone Encounter (Signed)
 This has been addressed in other MyChart message, will close encounter.

## 2023-08-25 ENCOUNTER — Ambulatory Visit: Admitting: Orthopedic Surgery

## 2023-08-25 ENCOUNTER — Encounter: Payer: Self-pay | Admitting: Orthopedic Surgery

## 2023-08-25 ENCOUNTER — Other Ambulatory Visit: Payer: Self-pay

## 2023-08-25 ENCOUNTER — Other Ambulatory Visit (INDEPENDENT_AMBULATORY_CARE_PROVIDER_SITE_OTHER)

## 2023-08-25 VITALS — BP 131/87 | HR 58 | Ht 69.0 in | Wt 230.0 lb

## 2023-08-25 DIAGNOSIS — G8929 Other chronic pain: Secondary | ICD-10-CM | POA: Diagnosis not present

## 2023-08-25 DIAGNOSIS — M25561 Pain in right knee: Secondary | ICD-10-CM

## 2023-08-25 DIAGNOSIS — M222X1 Patellofemoral disorders, right knee: Secondary | ICD-10-CM

## 2023-08-25 DIAGNOSIS — M25562 Pain in left knee: Secondary | ICD-10-CM

## 2023-08-25 DIAGNOSIS — M222X2 Patellofemoral disorders, left knee: Secondary | ICD-10-CM | POA: Diagnosis not present

## 2023-08-25 NOTE — Progress Notes (Signed)
   Chief Complaint  Patient presents with   Knee Pain    Both right one is worse     This is a 43 year old female with bilateral knee pain right greater than left she has had pain in the left knee for a year and a half in the right knee for 6 months.  The pain is anterior.  She takes meloxicam .  No other treatment no history of trauma    Knee Pain     Problem list, medical hx, medications and allergies reviewed     Physical Exam Vitals and nursing note reviewed.  Constitutional:      Appearance: Normal appearance.  HENT:     Head: Normocephalic and atraumatic.   Eyes:     General: No scleral icterus.       Right eye: No discharge.        Left eye: No discharge.     Extraocular Movements: Extraocular movements intact.     Conjunctiva/sclera: Conjunctivae normal.     Pupils: Pupils are equal, round, and reactive to light.    Cardiovascular:     Rate and Rhythm: Normal rate.     Pulses: Normal pulses.   Musculoskeletal:     Comments: She does have some signs of ligament laxity  Standing alignment looks normal  Range of motion of the hips and knees normal.  Ligaments are stable in the knee.  No subluxation of the kneecaps are noted normal tracking is noted.  Tenderness in the quadriceps left anterior patellar tendon right     Skin:    General: Skin is warm and dry.     Capillary Refill: Capillary refill takes less than 2 seconds.   Neurological:     General: No focal deficit present.     Mental Status: She is alert and oriented to person, place, and time.   Psychiatric:        Mood and Affect: Mood normal.        Behavior: Behavior normal.        Thought Content: Thought content normal.        Judgment: Judgment normal.    DG Knee 1-2 Views Right Result Date: 08/25/2023 Right knee Anterior knee pain over 6 months The alignment at 45 degrees is normal No arthritic changes Normal patellofemoral joint  DG Knee 1-2 Views Left Result Date: 08/25/2023 Left knee  Anterior knee pain over 6 months The alignment at 45 degrees is normal No arthritic changes Normal patellofemoral joint     Assessment    Encounter Diagnoses  Name Primary?   Bilateral chronic knee pain    Patellofemoral pain syndrome of both knees Yes   Plan:  #1 NSAIDs over-the-counter or prescription #2 topical medications #3 ice or heat depending on response #4 physical therapy, patient would like home program cannot afford the co-pays #5 knee sleeve is fine for support  There is no real treatment for this other than described there is no surgical intervention that can alleviate this without a demonstrable abnormality on physical exam or imaging  No follow-up scheduled

## 2023-08-25 NOTE — Patient Instructions (Addendum)
 Diagnosis patellofemoral pain syndrome  Nonsurgical treatment for this problem.  This has to be managed by the patient with exercises weight management, activity modification, appropriate anti-inflammatories.  And physical therapy.  You can also use ice or heat to help knee pain along with meloxicam  or ibuprofen or Aleve    Use Aspercreme, Biofreeze or Voltaren  gel over the counter 2-3 times daily make sure you rub it in well each time you use it.   Physical therapy has been ordered for you in [place] . You will need to call and schedule the phone number is [phone]

## 2023-08-25 NOTE — Progress Notes (Signed)
  Intake history:  BP 131/87   Pulse (!) 58   Ht 5' 9 (1.753 m)   Wt 230 lb (104.3 kg)   BMI 33.97 kg/m  Body mass index is 33.97 kg/m.    WHAT ARE WE SEEING YOU FOR TODAY?   bilateral knee(s)  How long has this bothered you? (DOI?DOS?WS?)   One and a half years left knee and 6 months right knee / right worse   Anticoag.  No  Diabetes No  Heart disease No  Hypertension No  SMOKING HX Yes  Kidney disease No  Any ALLERGIES _________________ Allergies  Allergen Reactions   Penicillins    _____________________________   Treatment:  Have you taken:  Tylenol  no   Advil No  Had PT No  Had injection No  Other  _______________on Meloxicam  __________

## 2023-09-11 ENCOUNTER — Ambulatory Visit: Admitting: Orthopedic Surgery

## 2023-09-11 ENCOUNTER — Ambulatory Visit: Admitting: Family Medicine

## 2023-09-29 ENCOUNTER — Other Ambulatory Visit: Payer: Self-pay | Admitting: Family Medicine

## 2023-09-29 DIAGNOSIS — G8929 Other chronic pain: Secondary | ICD-10-CM

## 2023-10-24 ENCOUNTER — Other Ambulatory Visit: Payer: Self-pay | Admitting: Family Medicine

## 2023-10-24 DIAGNOSIS — G8929 Other chronic pain: Secondary | ICD-10-CM

## 2023-10-24 DIAGNOSIS — G43801 Other migraine, not intractable, with status migrainosus: Secondary | ICD-10-CM

## 2023-11-24 ENCOUNTER — Ambulatory Visit: Admitting: Family Medicine

## 2023-11-24 ENCOUNTER — Encounter: Payer: Self-pay | Admitting: Family Medicine

## 2023-11-24 VITALS — BP 111/68 | HR 67 | Temp 98.3°F | Ht 69.0 in | Wt 228.0 lb

## 2023-11-24 DIAGNOSIS — M25561 Pain in right knee: Secondary | ICD-10-CM

## 2023-11-24 DIAGNOSIS — R0781 Pleurodynia: Secondary | ICD-10-CM

## 2023-11-24 DIAGNOSIS — T148XXA Other injury of unspecified body region, initial encounter: Secondary | ICD-10-CM

## 2023-11-24 DIAGNOSIS — R5382 Chronic fatigue, unspecified: Secondary | ICD-10-CM

## 2023-11-24 DIAGNOSIS — S90425A Blister (nonthermal), left lesser toe(s), initial encounter: Secondary | ICD-10-CM

## 2023-11-24 DIAGNOSIS — J410 Simple chronic bronchitis: Secondary | ICD-10-CM | POA: Diagnosis not present

## 2023-11-24 DIAGNOSIS — Z23 Encounter for immunization: Secondary | ICD-10-CM | POA: Diagnosis not present

## 2023-11-24 DIAGNOSIS — G8929 Other chronic pain: Secondary | ICD-10-CM

## 2023-11-24 DIAGNOSIS — M25562 Pain in left knee: Secondary | ICD-10-CM

## 2023-11-24 MED ORDER — PREDNISONE 20 MG PO TABS: 40.0000 mg | ORAL_TABLET | Freq: Every day | ORAL | 0 refills | Status: AC

## 2023-11-24 NOTE — Progress Notes (Addendum)
 Acute Office Visit  Subjective:     Patient ID: Cynthia Hayden, female    DOB: 1980-04-17, 43 y.o.   MRN: [id number]  Chief Complaint  Patient presents with   side pain    HPI  History of Present Illness   Cynthia Hayden is a 43 year old female who presents with left upper abdominal pain.  Left rib pain - Present for approximately one week - Initially intermittent, now constant and more intense - Described as dull, achy, cramp-like, and similar to a 'catch' - Tender to palpation - Initially noticeable when sitting, now persists even when lying down at night - No associated nausea, vomiting, constipation, diarrhea, urinary symptoms, trouble swallowing, or pain with eating - No fever - Coughing does not worsen the pain - She does cough often at night due to chronic bronchitis. Denies increased wheezing, shortness of breath. Non-productive cough - No recent heavy lifting, but uses upper body to assist in standing due to knee issues - No injury  Digital dermatitis and nail changes - Blisters on toes present for a couple of weeks - Blisters are itchy and swollen - No use of over-the-counter treatments - Toenails have been falling off - Suspects fungal infection - History of eczema with blisters on hands  Chronic fatigue - Chronic fatigue present - Exploring medication options for symptom management - Sleep study was nondiagnostic  Current medications - Currently taking meloxicam  and gabapentin        ROS As per HPI.     Objective:    BP 111/68   Pulse 67   Temp 98.3 F (36.8 C) (Temporal)   Ht 5' 9 (1.753 m)   Wt 228 lb (103.4 kg)   SpO2 100%   BMI 33.67 kg/m    Physical Exam Vitals and nursing note reviewed.  Constitutional:      General: She is not in acute distress.    Appearance: Normal appearance. She is not ill-appearing, toxic-appearing or diaphoretic.  HENT:     Nose: Nose normal. No congestion.     Mouth/Throat:     Mouth: Mucous membranes  are moist.     Pharynx: Oropharynx is clear.  Eyes:     General:        Right eye: No discharge.        Left eye: No discharge.     Conjunctiva/sclera: Conjunctivae normal.  Cardiovascular:     Rate and Rhythm: Normal rate and regular rhythm.     Pulses: Normal pulses.     Heart sounds: Normal heart sounds. No murmur heard. Pulmonary:     Effort: Pulmonary effort is normal. No respiratory distress.     Breath sounds: Normal breath sounds.  Chest:     Chest wall: Tenderness (left side, lower ribs) present.  Abdominal:     General: Bowel sounds are normal. There is no distension.     Palpations: Abdomen is soft. There is no mass.     Tenderness: There is no abdominal tenderness. There is no guarding or rebound.  Musculoskeletal:     Thoracic back: Tenderness (left side) present. No swelling, edema, signs of trauma, spasms or bony tenderness. Normal range of motion.     Right lower leg: No edema.     Left lower leg: No edema.  Skin:    General: Skin is warm and dry.     Comments: Fluid filled blisters to left 2nd and 3rd toe, dorsal aspect  Neurological:  General: No focal deficit present.     Mental Status: She is alert and oriented to person, place, and time.  Psychiatric:        Mood and Affect: Mood normal.        Behavior: Behavior normal.     No results found for any visits on 11/24/23.      Assessment & Plan:   Cynthia Hayden was seen today for side pain.  Diagnoses and all orders for this visit:  Rib pain on left side -     predniSONE  (DELTASONE ) 20 MG tablet; Take 2 tablets (40 mg total) by mouth daily with breakfast for 5 days.  Bilateral chronic knee pain  Simple chronic bronchitis (HCC)  Blister  Chronic fatigue  Encounter for immunization -     Flu vaccine trivalent PF, 6mos and older(Flulaval,Afluria,Fluarix,Fluzone)      Left rib pain Likely MSK. Continue mobic  - Prescribe prednisone  burst to reduce inflammation.  Chronic bilateral knee pain -  Continue current anti-inflammatory medication. - Follow up with ortho as needed.   Chronic bronchitis Denies increased symptoms.  - Discussed prednisone  burst which may also help with cough.  Blistering dermatitis of toes, possible dyshidrotic eczema Blistering dermatitis with swelling and itching, differential includes dyshidrotic eczema. - Apply kenalog  cream twice daily for one week. She has this available at home - If no improvement, refer to a podiatrist.  Chronic fatigue  ? CFS. She will schedule a follow up appointment to discuss in further detail.      The patient indicates understanding of these issues and agrees with the plan.   Cynthia CHRISTELLA Search, FNP

## 2023-11-24 NOTE — Addendum Note (Signed)
 Addended by: JOESPH ANNABELLA HERO on: 11/24/2023 01:03 PM   Modules accepted: Level of Service

## 2023-11-28 ENCOUNTER — Other Ambulatory Visit: Payer: Self-pay | Admitting: Family Medicine

## 2023-11-28 DIAGNOSIS — G43801 Other migraine, not intractable, with status migrainosus: Secondary | ICD-10-CM

## 2023-12-08 ENCOUNTER — Ambulatory Visit: Admitting: Family Medicine

## 2023-12-08 ENCOUNTER — Other Ambulatory Visit

## 2023-12-08 VITALS — BP 103/65 | HR 73 | Temp 98.0°F | Ht 69.0 in | Wt 228.0 lb

## 2023-12-08 DIAGNOSIS — R0602 Shortness of breath: Secondary | ICD-10-CM | POA: Diagnosis not present

## 2023-12-08 DIAGNOSIS — R002 Palpitations: Secondary | ICD-10-CM

## 2023-12-08 DIAGNOSIS — R5382 Chronic fatigue, unspecified: Secondary | ICD-10-CM

## 2023-12-08 DIAGNOSIS — E559 Vitamin D deficiency, unspecified: Secondary | ICD-10-CM

## 2023-12-08 DIAGNOSIS — R350 Frequency of micturition: Secondary | ICD-10-CM

## 2023-12-08 LAB — URINALYSIS, ROUTINE W REFLEX MICROSCOPIC
Bilirubin, UA: NEGATIVE
Glucose, UA: NEGATIVE
Ketones, UA: NEGATIVE
Nitrite, UA: NEGATIVE
Protein,UA: NEGATIVE
RBC, UA: NEGATIVE
Specific Gravity, UA: >=1.03 — AB (ref 1.005–1.030)
Urobilinogen, Ur: 0.2 mg/dL (ref 0.2–1.0)
pH, UA: 5.5 (ref 5.0–7.5)

## 2023-12-08 LAB — MICROSCOPIC EXAMINATION
RBC, Urine: NONE SEEN /HPF (ref 0–2)
Renal Epithel, UA: NONE SEEN /HPF
Yeast, UA: NONE SEEN

## 2023-12-08 NOTE — Patient Instructions (Signed)
Visible

## 2023-12-10 ENCOUNTER — Encounter: Payer: Self-pay | Admitting: Family Medicine

## 2023-12-10 ENCOUNTER — Ambulatory Visit: Payer: Self-pay | Admitting: Family Medicine

## 2023-12-10 DIAGNOSIS — G8929 Other chronic pain: Secondary | ICD-10-CM

## 2023-12-10 DIAGNOSIS — R9431 Abnormal electrocardiogram [ECG] [EKG]: Secondary | ICD-10-CM

## 2023-12-10 NOTE — Telephone Encounter (Signed)
 [email or web address]

## 2023-12-11 ENCOUNTER — Encounter: Payer: Self-pay | Admitting: Family Medicine

## 2023-12-11 ENCOUNTER — Encounter (INDEPENDENT_AMBULATORY_CARE_PROVIDER_SITE_OTHER): Payer: Self-pay | Admitting: Family Medicine

## 2023-12-11 ENCOUNTER — Ambulatory Visit: Admitting: Family Medicine

## 2023-12-11 DIAGNOSIS — M25561 Pain in right knee: Secondary | ICD-10-CM

## 2023-12-11 DIAGNOSIS — R0789 Other chest pain: Secondary | ICD-10-CM

## 2023-12-11 DIAGNOSIS — G8929 Other chronic pain: Secondary | ICD-10-CM

## 2023-12-11 DIAGNOSIS — M25562 Pain in left knee: Secondary | ICD-10-CM

## 2023-12-11 MED ORDER — CELECOXIB 100 MG PO CAPS: 100.0000 mg | ORAL_CAPSULE | Freq: Two times a day (BID) | ORAL | 3 refills | Status: DC

## 2023-12-11 NOTE — Telephone Encounter (Signed)
 Please see the MyChart message reply(ies) for my assessment and plan.    This patient gave consent for this Medical Advice Message and is aware that it may result in a bill to [employer], as well as the possibility of receiving a bill for a co-payment or deductible. They are an established patient, but are not seeking medical advice exclusively about a problem treated during an in person or video visit in the last seven days. I did not recommend an in person or video visit within seven days of my reply.    I spent a total of 5 minutes cumulative time within 7 days through [employer].  Annabella CHRISTELLA Search, FNP

## 2023-12-11 NOTE — Progress Notes (Signed)
 Established Patient Office Visit  Subjective   Patient ID: Cynthia Hayden, female    DOB: 19-May-1980  Age: 43 y.o. MRN: [id number]  Chief Complaint  Patient presents with   Fatigue    HPI   History of Present Illness   Cynthia Hayden is a 43 year old female who presents with severe fatigue and associated symptoms.  She has been experiencing severe fatigue for the past one to two years, which has progressively worsened. The fatigue is not significantly improved by rest, and she often feels physically drained after mild activities. She feels tired even after waking up and has had to reduce her activity level to avoid exacerbating her fatigue. On good days, she can perform minimal activities, but these are followed by increased fatigue. She rates her energy levels as very low, around 20-30 out of 100, and her fatigue as high, around the middle of the scale. She reports that she is much worse than she was a year ago and has had to limit most activities, including social interactions.  She experiences difficulty with sleep, stating she does not have trouble falling asleep but wakes up early around 3 AM. Despite taking naps, she feels tired most of the time.  She reports cognitive difficulties, including forgetfulness, trouble paying attention, and finding the right words. These symptoms have worsened over time.  She experiences bladder issues, such as frequent urination, especially when coughing or sneezing. She also reports mild dizziness and palpitations, describing them as a fluttering or skipping sensation in her heart. Some shortness of breath with activity.   She has a history of headaches, primarily migraines, which have not changed significantly since the onset of fatigue. She also reports muscle and joint pain, particularly in her knees, which have been consistent before the fatigue started.  She mentions a significant reduction in her ability to perform household activities, which now  take her weeks to complete compared to a single day in the past. She used to work extensively and engage in family activities, but her current energy levels have drastically reduced her participation in these activities.  No recent illness, accident, or significant life event preceded the onset of fatigue, although she recalls a drop in her vitamin levels around the same time. She does not believe her medications are causing her fatigue, as she has been on them before without issues.         ROS As per HPI.    Objective:     BP 103/65   Pulse 73   Temp 98 F (36.7 C) (Temporal)   Ht 5' 9 (1.753 m)   Wt 228 lb (103.4 kg)   SpO2 95%   BMI 33.67 kg/m    Physical Exam Vitals and nursing note reviewed.  Constitutional:      General: She is not in acute distress.    Appearance: She is not ill-appearing, toxic-appearing or diaphoretic.  Cardiovascular:     Rate and Rhythm: Normal rate and regular rhythm.     Pulses: Normal pulses.     Heart sounds: Normal heart sounds. No murmur heard. Pulmonary:     Effort: Pulmonary effort is normal. No respiratory distress.     Breath sounds: Normal breath sounds.  Musculoskeletal:     Right lower leg: No edema.     Left lower leg: No edema.  Skin:    General: Skin is warm and dry.  Neurological:     General: No focal deficit present.  Mental Status: She is alert and oriented to person, place, and time.  Psychiatric:        Mood and Affect: Mood normal.        Behavior: Behavior normal.      Results for orders placed or performed in visit on 12/08/23  Microscopic Examination   Urine  Result Value Ref Range   WBC, UA 0-5 0 - 5 /hpf   RBC, Urine None seen 0 - 2 /hpf   Epithelial Cells (non renal) 0-10 0 - 10 /hpf   Renal Epithel, UA None seen None seen /hpf   Bacteria, UA Few (A) None seen/Few   Yeast, UA None seen None seen  Urinalysis, Routine w reflex microscopic  Result Value Ref Range   Specific Gravity, UA       >=1.030 (A) 1.005 - 1.030   pH, UA 5.5 5.0 - 7.5   Color, UA Yellow Yellow   Appearance Ur Clear Clear   Leukocytes,UA 1+ (A) Negative   Protein,UA Negative Negative/Trace   Glucose, UA Negative Negative   Ketones, UA Negative Negative   RBC, UA Negative Negative   Bilirubin, UA Negative Negative   Urobilinogen, Ur 0.2 0.2 - 1.0 mg/dL   Nitrite, UA Negative Negative   Microscopic Examination See below:       The 10-year ASCVD risk score (Arnett DK, et al., 2019) is: 2.5%    Assessment & Plan:   Cynthia Hayden was seen today for fatigue.  Diagnoses and all orders for this visit:  Chronic fatigue -     Cortisol-am, blood; Future -     Anemia Profile B; Future -     CMP14+EGFR; Future -     Sedimentation Rate; Future -     HepB+HepC+HIV Panel; Future -     Lyme Disease Serology w/Reflex; Future -     ECHOCARDIOGRAM COMPLETE; Future  Palpitations -     LONG TERM MONITOR XT (3-14 DAYS); Future -     ECHOCARDIOGRAM COMPLETE; Future  Shortness of breath -     ECHOCARDIOGRAM COMPLETE; Future  Urinary frequency -     Urinalysis, Routine w reflex microscopic -     Urine Culture -     Microscopic Examination  Vitamin D  deficiency -     VITAMIN D  25 Hydroxy (Vit-D Deficiency, Fractures); Future   Assessment and Plan    Chronic fatigue with cognitive impairment, post-exertional malaise, and orthostatic intolerance Severe fatigue, cognitive impairment, post-exertional malaise, and orthostatic intolerance worsening over 1-2 years. Fatigue unrelieved by rest, exacerbated by activities. Suspect CFS. Discussed pacing of energy, keeps a diary of symptoms and fatigue. Will check labs as below.  - Order lab work: cortisol level, B12, vitamin D , Lyme disease, [facility] spotted fever, inflammatory markers, HIV, hepatitis B, hepatitis C. - Schedule early morning lab appointment for cortisol level. - Order heart monitor for two weeks. - Order echocardiogram. - Educated on pacing and  energy envelope management. - Recommend using the Visible app to track symptoms and activity.  Palpitations, shortness of breath and dizziness Intermittent palpitations and dizziness, evaluating for cardiac causes. - Order heart monitor for two weeks. - Order echocardiogram.  Bladder dysfunction (urinary urgency) Urinary urgency without dysuria, particularly when coughing or sneezing. - Check urine analysis.      Return for return for lab appt before 9 am.   The patient indicates understanding of these issues and agrees with the plan.  Cynthia Hayden Search, FNP

## 2023-12-15 ENCOUNTER — Encounter: Payer: Self-pay | Admitting: Family Medicine

## 2023-12-17 ENCOUNTER — Encounter: Payer: Self-pay | Admitting: Family Medicine

## 2023-12-17 DIAGNOSIS — Z8349 Family history of other endocrine, nutritional and metabolic diseases: Secondary | ICD-10-CM

## 2023-12-18 ENCOUNTER — Other Ambulatory Visit

## 2023-12-18 DIAGNOSIS — Z8349 Family history of other endocrine, nutritional and metabolic diseases: Secondary | ICD-10-CM | POA: Diagnosis not present

## 2023-12-18 DIAGNOSIS — E559 Vitamin D deficiency, unspecified: Secondary | ICD-10-CM

## 2023-12-18 DIAGNOSIS — R5382 Chronic fatigue, unspecified: Secondary | ICD-10-CM | POA: Diagnosis not present

## 2023-12-19 LAB — CMP14+EGFR
ALT: 13 IU/L (ref 0–32)
AST: 13 IU/L (ref 0–40)
Albumin: 4.1 g/dL (ref 3.9–4.9)
Alkaline Phosphatase: 79 IU/L (ref 41–116)
BUN/Creatinine Ratio: 19 (ref 9–23)
BUN: 18 mg/dL (ref 6–24)
Bilirubin Total: 0.2 mg/dL (ref 0.0–1.2)
CO2: 18 mmol/L — ABNORMAL LOW (ref 20–29)
Calcium: 8.8 mg/dL (ref 8.7–10.2)
Chloride: 106 mmol/L (ref 96–106)
Creatinine, Ser: 0.93 mg/dL (ref 0.57–1.00)
Globulin, Total: 2.6 g/dL (ref 1.5–4.5)
Glucose: 86 mg/dL (ref 70–99)
Potassium: 4.1 mmol/L (ref 3.5–5.2)
Sodium: 137 mmol/L (ref 134–144)
Total Protein: 6.7 g/dL (ref 6.0–8.5)
eGFR: 80 mL/min/1.73 (ref >59)

## 2023-12-19 LAB — ANEMIA PROFILE B
Basophils Absolute: 0.1 x10E3/uL (ref 0.0–0.2)
Basos: 1 %
EOS (ABSOLUTE): 0.1 x10E3/uL (ref 0.0–0.4)
Eos: 1 %
Ferritin: 86 ng/mL (ref 15–150)
Folate: 18.5 ng/mL (ref >3.0)
Hematocrit: 45.5 % (ref 34.0–46.6)
Hemoglobin: 14.7 g/dL (ref 11.1–15.9)
Immature Grans (Abs): 0 x10E3/uL (ref 0.0–0.1)
Immature Granulocytes: 0 %
Iron Saturation: 17 % (ref 15–55)
Iron: 53 ug/dL (ref 27–159)
Lymphocytes Absolute: 2.2 x10E3/uL (ref 0.7–3.1)
Lymphs: 25 %
MCH: 31.8 pg (ref 26.6–33.0)
MCHC: 32.3 g/dL (ref 31.5–35.7)
MCV: 99 fL — ABNORMAL HIGH (ref 79–97)
Monocytes Absolute: 0.5 x10E3/uL (ref 0.1–0.9)
Monocytes: 6 %
Neutrophils Absolute: 5.7 x10E3/uL (ref 1.4–7.0)
Neutrophils: 67 %
Platelets: 209 x10E3/uL (ref 150–450)
RBC: 4.62 x10E6/uL (ref 3.77–5.28)
RDW: 12.2 % (ref 11.7–15.4)
Retic Ct Pct: 1.2 % (ref 0.6–2.6)
Total Iron Binding Capacity: 303 ug/dL (ref 250–450)
UIBC: 250 ug/dL (ref 131–425)
Vitamin B-12: 377 pg/mL (ref 232–1245)
WBC: 8.5 x10E3/uL (ref 3.4–10.8)

## 2023-12-19 LAB — HEPB+HEPC+HIV PANEL
HIV Screen 4th Generation wRfx: NONREACTIVE
Hep B E Ab: NONREACTIVE
Hep B Surface Ab, Qual: REACTIVE
Hep C Virus Ab: NONREACTIVE

## 2023-12-19 LAB — ALPHA-1-ANTITRYPSIN PHENOTYP: A-1 Antitrypsin: 170 mg/dL (ref 100–188)

## 2023-12-19 LAB — CORTISOL-AM, BLOOD: Cortisol - AM: 9.3 ug/dL (ref 6.2–19.4)

## 2023-12-19 LAB — VITAMIN D 25 HYDROXY (VIT D DEFICIENCY, FRACTURES): Vit D, 25-Hydroxy: 31.2 ng/mL (ref 30.0–100.0)

## 2023-12-19 LAB — LYME DISEASE SEROLOGY W/REFLEX

## 2023-12-19 LAB — SEDIMENTATION RATE: Sed Rate: 19 mm/h (ref 0–32)

## 2023-12-26 DIAGNOSIS — R002 Palpitations: Secondary | ICD-10-CM | POA: Diagnosis not present

## 2023-12-30 DIAGNOSIS — M79675 Pain in left toe(s): Secondary | ICD-10-CM | POA: Diagnosis not present

## 2023-12-30 DIAGNOSIS — M79674 Pain in right toe(s): Secondary | ICD-10-CM | POA: Diagnosis not present

## 2023-12-31 ENCOUNTER — Encounter (INDEPENDENT_AMBULATORY_CARE_PROVIDER_SITE_OTHER): Payer: Self-pay | Admitting: Family Medicine

## 2023-12-31 DIAGNOSIS — G8929 Other chronic pain: Secondary | ICD-10-CM

## 2023-12-31 DIAGNOSIS — M25562 Pain in left knee: Secondary | ICD-10-CM | POA: Diagnosis not present

## 2023-12-31 DIAGNOSIS — M25561 Pain in right knee: Secondary | ICD-10-CM

## 2024-01-01 MED ORDER — MELOXICAM 15 MG PO TABS: 15.0000 mg | ORAL_TABLET | Freq: Every day | ORAL | 1 refills | Status: DC

## 2024-01-05 MED ORDER — DICLOFENAC SODIUM 75 MG PO TBEC: 75.0000 mg | DELAYED_RELEASE_TABLET | Freq: Two times a day (BID) | ORAL | 3 refills | Status: AC

## 2024-01-07 NOTE — Addendum Note (Signed)
 Addended by: RANDINE ARNULFO MATSU on: 01/07/2024 04:13 PM   Modules accepted: Orders

## 2024-01-12 NOTE — Telephone Encounter (Signed)
 Diagnoses and all orders for this visit:  Bilateral chronic knee pain -     Ambulatory referral to Orthopedic Surgery -     Discontinue: meloxicam  (MOBIC ) 15 MG tablet; Take 1 tablet (15 mg total) by mouth daily. -     diclofenac  (VOLTAREN ) 75 MG EC tablet; Take 1 tablet (75 mg total) by mouth 2 (two) times daily.    Please see the MyChart message reply(ies) for my assessment and plan.    This patient gave consent for this Medical Advice Message and is aware that it may result in a bill to [employer], as well as the possibility of receiving a bill for a co-payment or deductible. They are an established patient, but are not seeking medical advice exclusively about a problem treated during an in person or video visit in the last seven days. I did not recommend an in person or video visit within seven days of my reply.    I spent a total of 5 minutes cumulative time within 7 days through [employer].  Annabella CHRISTELLA Search, FNP

## 2024-01-21 ENCOUNTER — Ambulatory Visit (HOSPITAL_COMMUNITY)

## 2024-01-27 ENCOUNTER — Ambulatory Visit: Attending: Cardiology | Admitting: Cardiology

## 2024-01-27 ENCOUNTER — Encounter: Payer: Self-pay | Admitting: Cardiology

## 2024-01-27 VITALS — BP 132/84 | HR 65 | Ht 68.0 in | Wt 230.0 lb

## 2024-01-27 DIAGNOSIS — R002 Palpitations: Secondary | ICD-10-CM | POA: Diagnosis not present

## 2024-01-27 NOTE — Patient Instructions (Signed)
 Medication Instructions:  Continue all current medications.   Labwork: none  Testing/Procedures: none  Follow-Up: 6 months   Any Other Special Instructions Will Be Listed Below (If Applicable).   If you need a refill on your cardiac medications before your next appointment, please call your pharmacy.

## 2024-01-27 NOTE — Progress Notes (Signed)
 Clinical Summary Cynthia Hayden is a 43 y.o.female seen today as a new consult, referred by NP Joesph for the following medical problems.   1.Palpitations - some feelings of heart racing, occurs few times  a week. Lasts about 1 minutes. Can have light headedness.  - coffee x 1 cup. Soda x 1 per [employer] small bottle. No tea, no energy drinks, no EtoH - pcp ordered outpatient monitor - 12/2023 monitor 4 days of date: rare ectopy, isolated 19 beat run of SVT. Reported symptoms correlated with NSR.    2.Chronic fatigue - ongoing workup with pcp - hypersomnolence. Sleep study pending.   3. SOB - echo pending - does have report history of COPD, smoking history.    Past Medical History:  Diagnosis Date   Anxiety    Arthritis    Depression    Hypermobility syndrome    Migraine    RLS (restless legs syndrome)      Allergies  Allergen Reactions   Penicillins      Current Outpatient Medications  Medication Sig Dispense Refill   Cholecalciferol (VITAMIN D -3 PO) Take by mouth.     citalopram  (CELEXA ) 40 MG tablet Take 1 tablet (40 mg total) by mouth daily. 90 tablet 3   cyanocobalamin  (VITAMIN B12) 1000 MCG/ML injection Inject 1,000 mcg/mL into the muscle every 30 days. 30 mL 0   diclofenac  (VOLTAREN ) 75 MG EC tablet Take 1 tablet (75 mg total) by mouth 2 (two) times daily. 60 tablet 3   folic acid  (FOLVITE ) 1 MG tablet Take 1 tablet (1 mg total) by mouth daily. 90 tablet 3   gabapentin  (NEURONTIN ) 400 MG capsule Take 2 capsules (800 mg total) by mouth at bedtime. 180 capsule 3   pramipexole  (MIRAPEX ) 1 MG tablet Take 1 tablet (1 mg total) by mouth 3 (three) times daily. 90 tablet 2   SUMAtriptan  (IMITREX ) 100 MG tablet TAKE 1 TABLET AT ONSET OF MIGRAINE HEADACHE MAY REPEAT ONCE IN 2 HOURS (MAX OF 2 TABLETS/24 HOURS) 9 tablet 1   topiramate  (TOPAMAX ) 100 MG tablet TAKE ONE (1) TABLET BY MOUTH TWO (2) TIMES DAILY 180 tablet 0   No current facility-administered medications for  this visit.     No past surgical history on file.   Allergies  Allergen Reactions   Penicillins       Family History  Problem Relation Age of Onset   Diabetes Mother      Social History Cynthia Hayden reports that she has been smoking cigarettes. She has never used smokeless tobacco. Cynthia Hayden reports no history of alcohol use.    Physical Examination Today's Vitals   01/27/24 0953  BP: 132/84  Pulse: 65  SpO2: 99%  Weight: 230 lb (104.3 kg)  Height: 5' 8 (1.727 m)   Body mass index is 34.97 kg/m.  Gen: resting comfortably, no acute distress HEENT: no scleral icterus, pupils equal round and reactive, no palptable cervical adenopathy,  CV: RRR, no m/rg, no jvd Resp: Clear to auscultation bilaterally GI: abdomen is soft, non-tender, non-distended, normal bowel sounds, no hepatosplenomegaly MSK: extremities are warm, no edema.  Skin: warm, no rash Neuro:  no focal deficits Psych: appropriate affect    Assessment and Plan  1.Palpitations - fairly benign monitor. Rare ectopy, isolated run of SVT. - findings would explain her palpitations which she describes as mild and tolerable, would not play any role in her generalized fatigue - discussed possibly starting beta blocker or CCB for symptoms,  at this time she reports symptoms are mild and does not favor. If started would need to use low dose due to low normal HRs - EKG today shows NSR  2. SOB - possibly related to her lung disease, does have echo pending ordered by pcp   F/u 6 months      Dorn PHEBE Ross, M.D.

## 2024-02-04 ENCOUNTER — Other Ambulatory Visit (HOSPITAL_COMMUNITY)

## 2024-02-04 ENCOUNTER — Ambulatory Visit (HOSPITAL_COMMUNITY)
Admission: RE | Admit: 2024-02-04 | Discharge: 2024-02-04 | Disposition: A | Source: Ambulatory Visit | Attending: Family Medicine

## 2024-02-04 DIAGNOSIS — R0602 Shortness of breath: Secondary | ICD-10-CM | POA: Insufficient documentation

## 2024-02-04 DIAGNOSIS — R5382 Chronic fatigue, unspecified: Secondary | ICD-10-CM | POA: Diagnosis not present

## 2024-02-04 DIAGNOSIS — R0600 Dyspnea, unspecified: Secondary | ICD-10-CM | POA: Diagnosis not present

## 2024-02-04 DIAGNOSIS — R002 Palpitations: Secondary | ICD-10-CM | POA: Diagnosis present

## 2024-02-04 LAB — ECHOCARDIOGRAM COMPLETE
AR max vel: 2.76 cm2
AV Area VTI: 2.85 cm2
AV Area mean vel: 2.9 cm2
AV Mean grad: 4 mmHg
AV Peak grad: 8.6 mmHg
Ao pk vel: 1.47 m/s
Area-P 1/2: 2.54 cm2
Calc EF: 66.1 %
S' Lateral: 3.25 cm
Single Plane A2C EF: 66.3 %
Single Plane A4C EF: 65.7 %

## 2024-02-04 NOTE — Progress Notes (Signed)
*  PRELIMINARY RESULTS* Echocardiogram 2D Echocardiogram has been performed.  Cynthia Hayden 02/04/2024, 12:01 PM

## 2024-02-06 ENCOUNTER — Other Ambulatory Visit: Payer: Self-pay | Admitting: Family Medicine

## 2024-02-06 DIAGNOSIS — G2581 Restless legs syndrome: Secondary | ICD-10-CM

## 2024-02-09 ENCOUNTER — Ambulatory Visit: Admitting: Orthopedic Surgery

## 2024-02-16 NOTE — Addendum Note (Signed)
 Addended by: JOESPH ANNABELLA HERO on: 02/16/2024 09:33 AM   Modules accepted: Orders

## 2024-07-22 ENCOUNTER — Ambulatory Visit: Admitting: Nurse Practitioner
